# Patient Record
Sex: Female | Born: 1965 | Race: White | Hispanic: No | Marital: Single | Smoking: Former smoker
Health system: Southern US, Community
[De-identification: ages and names within clinical notes are randomized; demographics above are authoritative.]

## PROBLEM LIST (undated history)

## (undated) DIAGNOSIS — S82009A Unspecified fracture of unspecified patella, initial encounter for closed fracture: Secondary | ICD-10-CM

## (undated) DIAGNOSIS — K219 Gastro-esophageal reflux disease without esophagitis: Secondary | ICD-10-CM

## (undated) DIAGNOSIS — E119 Type 2 diabetes mellitus without complications: Secondary | ICD-10-CM

## (undated) DIAGNOSIS — E785 Hyperlipidemia, unspecified: Secondary | ICD-10-CM

## (undated) DIAGNOSIS — T7840XA Allergy, unspecified, initial encounter: Secondary | ICD-10-CM

## (undated) DIAGNOSIS — S62609A Fracture of unspecified phalanx of unspecified finger, initial encounter for closed fracture: Secondary | ICD-10-CM

## (undated) HISTORY — DX: Unspecified fracture of unspecified patella, initial encounter for closed fracture: S82.009A

## (undated) HISTORY — DX: Gastro-esophageal reflux disease without esophagitis: K21.9

## (undated) HISTORY — PX: TONSILLECTOMY: SUR1361

## (undated) HISTORY — PX: BREAST BIOPSY: SHX20

## (undated) HISTORY — DX: Fracture of unspecified phalanx of unspecified finger, initial encounter for closed fracture: S62.609A

## (undated) HISTORY — DX: Allergy, unspecified, initial encounter: T78.40XA

## (undated) HISTORY — DX: Type 2 diabetes mellitus without complications: E11.9

## (undated) HISTORY — DX: Hyperlipidemia, unspecified: E78.5

---

## 2001-11-12 ENCOUNTER — Ambulatory Visit (HOSPITAL_COMMUNITY): Admission: RE | Admit: 2001-11-12 | Discharge: 2001-11-12 | Payer: Self-pay | Admitting: Pulmonary Disease

## 2001-11-18 ENCOUNTER — Encounter (HOSPITAL_COMMUNITY): Admission: RE | Admit: 2001-11-18 | Discharge: 2001-12-18 | Payer: Self-pay | Admitting: Pulmonary Disease

## 2006-11-11 ENCOUNTER — Ambulatory Visit: Payer: Self-pay | Admitting: Gastroenterology

## 2006-11-20 ENCOUNTER — Ambulatory Visit (HOSPITAL_COMMUNITY): Admission: RE | Admit: 2006-11-20 | Discharge: 2006-11-20 | Payer: Self-pay | Admitting: Gastroenterology

## 2007-03-05 ENCOUNTER — Ambulatory Visit (HOSPITAL_COMMUNITY): Admission: RE | Admit: 2007-03-05 | Discharge: 2007-03-05 | Payer: Self-pay | Admitting: Pulmonary Disease

## 2007-04-29 DIAGNOSIS — E119 Type 2 diabetes mellitus without complications: Secondary | ICD-10-CM | POA: Insufficient documentation

## 2007-04-29 DIAGNOSIS — T7840XA Allergy, unspecified, initial encounter: Secondary | ICD-10-CM | POA: Insufficient documentation

## 2007-04-29 DIAGNOSIS — E7800 Pure hypercholesterolemia, unspecified: Secondary | ICD-10-CM | POA: Insufficient documentation

## 2007-04-29 DIAGNOSIS — J45909 Unspecified asthma, uncomplicated: Secondary | ICD-10-CM | POA: Insufficient documentation

## 2008-09-18 ENCOUNTER — Emergency Department (HOSPITAL_COMMUNITY): Admission: EM | Admit: 2008-09-18 | Discharge: 2008-09-18 | Payer: Self-pay | Admitting: Emergency Medicine

## 2009-11-05 ENCOUNTER — Observation Stay (HOSPITAL_COMMUNITY): Admission: EM | Admit: 2009-11-05 | Discharge: 2009-11-06 | Payer: Self-pay | Admitting: Emergency Medicine

## 2010-05-02 LAB — CARDIAC PANEL(CRET KIN+CKTOT+MB+TROPI)
CK, MB: 0.4 ng/mL (ref 0.3–4.0)
CK, MB: 0.4 ng/mL (ref 0.3–4.0)
CK, MB: 0.7 ng/mL (ref 0.3–4.0)
Relative Index: INVALID (ref 0.0–2.5)
Relative Index: INVALID (ref 0.0–2.5)
Relative Index: INVALID (ref 0.0–2.5)
Total CK: 16 U/L (ref 7–177)
Total CK: 16 U/L (ref 7–177)
Total CK: 18 U/L (ref 7–177)
Troponin I: <0.01 ng/mL (ref 0.00–0.06)
Troponin I: <0.01 ng/mL (ref 0.00–0.06)
Troponin I: <0.01 ng/mL (ref 0.00–0.06)

## 2010-05-02 LAB — D-DIMER, QUANTITATIVE: D-Dimer, Quant: 0.29 ug/mL-FEU (ref 0.00–0.48)

## 2010-05-02 LAB — GLUCOSE, CAPILLARY
Glucose-Capillary: 111 mg/dL — ABNORMAL HIGH (ref 70–99)
Glucose-Capillary: 144 mg/dL — ABNORMAL HIGH (ref 70–99)
Glucose-Capillary: 210 mg/dL — ABNORMAL HIGH (ref 70–99)
Glucose-Capillary: 72 mg/dL (ref 70–99)

## 2010-05-02 LAB — POCT CARDIAC MARKERS
CKMB, poc: <1 ng/mL — ABNORMAL LOW (ref 1.0–8.0)
CKMB, poc: <1 ng/mL — ABNORMAL LOW (ref 1.0–8.0)
Myoglobin, poc: 21.5 ng/mL (ref 12–200)
Myoglobin, poc: 26.8 ng/mL (ref 12–200)
Troponin i, poc: <0.05 ng/mL (ref 0.00–0.09)
Troponin i, poc: <0.05 ng/mL (ref 0.00–0.09)

## 2010-05-02 LAB — DIFFERENTIAL
Basophils Absolute: 0 10*3/uL (ref 0.0–0.1)
Basophils Absolute: 0 10*3/uL (ref 0.0–0.1)
Basophils Relative: 0 % (ref 0–1)
Basophils Relative: 0 % (ref 0–1)
Eosinophils Absolute: 0.1 10*3/uL (ref 0.0–0.7)
Eosinophils Absolute: 0.2 10*3/uL (ref 0.0–0.7)
Eosinophils Relative: 1 % (ref 0–5)
Eosinophils Relative: 2 % (ref 0–5)
Lymphocytes Relative: 27 % (ref 12–46)
Lymphocytes Relative: 42 % (ref 12–46)
Lymphs Abs: 2.6 10*3/uL (ref 0.7–4.0)
Lymphs Abs: 4.2 10*3/uL — ABNORMAL HIGH (ref 0.7–4.0)
Monocytes Absolute: 0.6 10*3/uL (ref 0.1–1.0)
Monocytes Absolute: 0.6 10*3/uL (ref 0.1–1.0)
Monocytes Relative: 6 % (ref 3–12)
Monocytes Relative: 6 % (ref 3–12)
Neutro Abs: 5 10*3/uL (ref 1.7–7.7)
Neutro Abs: 6.4 10*3/uL (ref 1.7–7.7)
Neutrophils Relative %: 50 % (ref 43–77)
Neutrophils Relative %: 66 % (ref 43–77)

## 2010-05-02 LAB — COMPREHENSIVE METABOLIC PANEL
ALT: 44 U/L — ABNORMAL HIGH (ref 0–35)
AST: 32 U/L (ref 0–37)
Albumin: 4 g/dL (ref 3.5–5.2)
Alkaline Phosphatase: 44 U/L (ref 39–117)
BUN: 9 mg/dL (ref 6–23)
CO2: 25 mEq/L (ref 19–32)
Calcium: 9.4 mg/dL (ref 8.4–10.5)
Chloride: 101 mEq/L (ref 96–112)
Creatinine, Ser: 0.54 mg/dL (ref 0.4–1.2)
GFR calc Af Amer: >60 mL/min (ref >60)
GFR calc non Af Amer: >60 mL/min (ref >60)
Glucose, Bld: 277 mg/dL — ABNORMAL HIGH (ref 70–99)
Potassium: 3.9 mEq/L (ref 3.5–5.1)
Sodium: 135 mEq/L (ref 135–145)
Total Bilirubin: 0.6 mg/dL (ref 0.3–1.2)
Total Protein: 7 g/dL (ref 6.0–8.3)

## 2010-05-02 LAB — URINALYSIS, ROUTINE W REFLEX MICROSCOPIC
Bilirubin Urine: NEGATIVE
Glucose, UA: >1000 mg/dL — AB
Hgb urine dipstick: NEGATIVE
Ketones, ur: NEGATIVE mg/dL
Leukocytes, UA: NEGATIVE
Nitrite: NEGATIVE
Protein, ur: NEGATIVE mg/dL
Specific Gravity, Urine: <1.005 — ABNORMAL LOW (ref 1.005–1.030)
Urobilinogen, UA: 0.2 mg/dL (ref 0.0–1.0)
pH: 6 (ref 5.0–8.0)

## 2010-05-02 LAB — BASIC METABOLIC PANEL
BUN: 8 mg/dL (ref 6–23)
CO2: 24 mEq/L (ref 19–32)
Calcium: 8.6 mg/dL (ref 8.4–10.5)
Chloride: 107 mEq/L (ref 96–112)
Creatinine, Ser: 0.65 mg/dL (ref 0.4–1.2)
GFR calc Af Amer: >60 mL/min (ref >60)
GFR calc non Af Amer: >60 mL/min (ref >60)
Glucose, Bld: 114 mg/dL — ABNORMAL HIGH (ref 70–99)
Potassium: 4.1 mEq/L (ref 3.5–5.1)
Sodium: 140 mEq/L (ref 135–145)

## 2010-05-02 LAB — CBC
HCT: 33.4 % — ABNORMAL LOW (ref 36.0–46.0)
HCT: 38.1 % (ref 36.0–46.0)
Hemoglobin: 11.5 g/dL — ABNORMAL LOW (ref 12.0–15.0)
Hemoglobin: 13.1 g/dL (ref 12.0–15.0)
MCH: 32.5 pg (ref 26.0–34.0)
MCH: 32.9 pg (ref 26.0–34.0)
MCHC: 34.3 g/dL (ref 30.0–36.0)
MCHC: 34.5 g/dL (ref 30.0–36.0)
MCV: 94.8 fL (ref 78.0–100.0)
MCV: 95.3 fL (ref 78.0–100.0)
Platelets: 249 10*3/uL (ref 150–400)
Platelets: 290 10*3/uL (ref 150–400)
RBC: 3.5 MIL/uL — ABNORMAL LOW (ref 3.87–5.11)
RBC: 4.02 MIL/uL (ref 3.87–5.11)
RDW: 12.3 % (ref 11.5–15.5)
RDW: 12.3 % (ref 11.5–15.5)
WBC: 10 10*3/uL (ref 4.0–10.5)
WBC: 9.7 10*3/uL (ref 4.0–10.5)

## 2010-05-02 LAB — LIPID PANEL
Cholesterol: 179 mg/dL (ref 0–200)
HDL: 34 mg/dL — ABNORMAL LOW (ref >39)
LDL Cholesterol: UNDETERMINED mg/dL (ref 0–99)
Total CHOL/HDL Ratio: 5.3 RATIO
Triglycerides: 503 mg/dL — ABNORMAL HIGH (ref <150)
VLDL: UNDETERMINED mg/dL (ref 0–40)

## 2010-05-02 LAB — PROTIME-INR
INR: 0.94 (ref 0.00–1.49)
Prothrombin Time: 12.8 seconds (ref 11.6–15.2)

## 2010-05-02 LAB — URINE MICROSCOPIC-ADD ON

## 2010-05-02 LAB — TSH: TSH: 4.578 u[IU]/mL — ABNORMAL HIGH (ref 0.350–4.500)

## 2010-05-02 LAB — HEMOGLOBIN A1C
Hgb A1c MFr Bld: 7.2 % — ABNORMAL HIGH (ref <5.7)
Mean Plasma Glucose: 160 mg/dL — ABNORMAL HIGH (ref <117)

## 2010-05-26 LAB — URINALYSIS, ROUTINE W REFLEX MICROSCOPIC
Bilirubin Urine: NEGATIVE
Glucose, UA: >1000 mg/dL — AB
Hgb urine dipstick: NEGATIVE
Ketones, ur: NEGATIVE mg/dL
Leukocytes, UA: NEGATIVE
Nitrite: NEGATIVE
Protein, ur: NEGATIVE mg/dL
Specific Gravity, Urine: 1.01 (ref 1.005–1.030)
Urobilinogen, UA: 0.2 mg/dL (ref 0.0–1.0)
pH: 5.5 (ref 5.0–8.0)

## 2010-05-26 LAB — COMPREHENSIVE METABOLIC PANEL
ALT: 18 U/L (ref 0–35)
AST: 20 U/L (ref 0–37)
Albumin: 4.3 g/dL (ref 3.5–5.2)
Alkaline Phosphatase: 42 U/L (ref 39–117)
BUN: 16 mg/dL (ref 6–23)
CO2: 28 mEq/L (ref 19–32)
Calcium: 9.8 mg/dL (ref 8.4–10.5)
Chloride: 99 mEq/L (ref 96–112)
Creatinine, Ser: 0.78 mg/dL (ref 0.4–1.2)
GFR calc Af Amer: >60 mL/min (ref >60)
GFR calc non Af Amer: >60 mL/min (ref >60)
Glucose, Bld: 256 mg/dL — ABNORMAL HIGH (ref 70–99)
Potassium: 4.1 mEq/L (ref 3.5–5.1)
Sodium: 137 mEq/L (ref 135–145)
Total Bilirubin: 1.1 mg/dL (ref 0.3–1.2)
Total Protein: 7.6 g/dL (ref 6.0–8.3)

## 2010-05-26 LAB — GLUCOSE, CAPILLARY
Glucose-Capillary: 128 mg/dL — ABNORMAL HIGH (ref 70–99)
Glucose-Capillary: 224 mg/dL — ABNORMAL HIGH (ref 70–99)

## 2010-05-26 LAB — DIFFERENTIAL
Basophils Absolute: 0.1 10*3/uL (ref 0.0–0.1)
Basophils Relative: 1 % (ref 0–1)
Eosinophils Absolute: 0.1 10*3/uL (ref 0.0–0.7)
Eosinophils Relative: 1 % (ref 0–5)
Lymphocytes Relative: 19 % (ref 12–46)
Lymphs Abs: 2.6 10*3/uL (ref 0.7–4.0)
Monocytes Absolute: 0.5 10*3/uL (ref 0.1–1.0)
Monocytes Relative: 4 % (ref 3–12)
Neutro Abs: 10.2 10*3/uL — ABNORMAL HIGH (ref 1.7–7.7)
Neutrophils Relative %: 76 % (ref 43–77)

## 2010-05-26 LAB — PREGNANCY, URINE: Preg Test, Ur: NEGATIVE

## 2010-05-26 LAB — POCT CARDIAC MARKERS
CKMB, poc: <1 ng/mL — ABNORMAL LOW (ref 1.0–8.0)
Myoglobin, poc: 39.2 ng/mL (ref 12–200)
Troponin i, poc: <0.05 ng/mL (ref 0.00–0.09)

## 2010-05-26 LAB — URINE MICROSCOPIC-ADD ON

## 2010-05-26 LAB — CBC
HCT: 42.8 % (ref 36.0–46.0)
Hemoglobin: 14.9 g/dL (ref 12.0–15.0)
MCHC: 34.9 g/dL (ref 30.0–36.0)
MCV: 94 fL (ref 78.0–100.0)
Platelets: 260 10*3/uL (ref 150–400)
RBC: 4.56 MIL/uL (ref 3.87–5.11)
RDW: 12.7 % (ref 11.5–15.5)
WBC: 13.4 10*3/uL — ABNORMAL HIGH (ref 4.0–10.5)

## 2010-07-02 NOTE — Assessment & Plan Note (Signed)
[facility]                         GASTROENTEROLOGY OFFICE NOTE   NAME:[name] [name] [name]                       MRN:          [id number]  DATE:11/11/2006                            DOB:          Aug 12, 1965    PROBLEM:  Abdominal distension and diarrhea.   REASON:  [name] [name] is a 45 year old white female referred for the  evaluation of the above. Over the past two years, she has been  complaining of chronic diarrhea. This followed what sounds like an  enteric infection that she acquired from well water. Up to that point,  she had no GI complaints. She was treated with antibiotics for this  infection, however, since that time she has been complaining of frequent  diarrhea up to six times a day. She will awaken with the urge to  defecate. There is no history of melena or hematochezia. Colonoscopy in  October 2007 apparently demonstrated small polyps including a  hyperplastic polyp. She underwent upper endoscopy in May 2008 that  showed erosions in either the stomach and/or the esophagus. She remains  on omeprazole. Most recently, she was empirically treated with  amoxicillin and metronidazole. She reports cessation of her diarrhea.  She is now having formed, but flaky stools. These occur 1 to 2 times a  day. She still complains of abdominal distension with discomfort. She  denies nausea or pyrosis.   PAST MEDICAL HISTORY:  Pertinent for diabetes and asthma.   FAMILY HISTORY:  Positive for her mother who had pancreatic cancer and  her father who had lymphoma. Both parents had diabetes.   MEDICATIONS:  1. Omeprazole.  2. Glipizide.  3. Metformin.  4. Simvastatin.  5. Metronidazole.   ALLERGIES:  SULFA.   SOCIAL HISTORY:  She neither smokes nor drinks. 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   REVIEW OF SYSTEMS:  Positive for back pain and fatigue.   PHYSICAL EXAMINATION:  VITAL SIGNS:  Pulse 76, blood pressure 122/80,  weight 191.  HEENT:  EOMI.  PERRLA.  Sclerae are anicteric.  Conjunctivae are pink.  NECK:  Supple without thyromegaly, adenopathy or carotid bruits.  CHEST:  Clear to auscultation and percussion without adventitious  sounds.  CARDIAC:  Regular rhythm; normal S1 S2.  There are no murmurs, gallops  or rubs.  ABDOMEN:  Bowel sounds are normoactive.  Abdomen is soft, nontender and  nondistended.  There are no abdominal masses, tenderness, splenic  enlargement or hepatomegaly.  EXTREMITIES:  Full range of motion.  No cyanosis, clubbing or edema.  RECTAL:  Deferred.   IMPRESSION:  1. Two year history of diarrhea following what sounds an enteric      infection. Symptoms certainly could be related to IBS following      infection. Bacterial overgrowth from a motility disorder remains in      consideration, especially in a few of her recent improvement of      symptoms after antibiotic therapy. I think that it is unlikely that      she has recurrent enteric infections or pseudomembranous colitis in      view of  her multiple stool studies that apparently were negative.      Microscopic colitis is also a consideration.  2. Abdominal pain and distension. Gastroparesis ought to be ruled out.      Symptoms may also be related to #1.  3. Diabetes.  4. History of colon polyps (questionable adenomatous).   RECOMMENDATION:  1. Hold GI workup for diarrhea unless symptoms recur. If this is      bacterial overgrowth, then I would expect her diarrhea to return      after several weeks.  2. Gastric emptying scan.     Barbette Hair. Arlyce Dice, MD,FACG  Electronically Signed    RDK/MedQ  DD: 11/11/2006  DT: 11/12/2006  Job #: [id number]   cc:   Foye Clock, MD  L. Delco, MD, [facility]

## 2010-07-02 NOTE — Letter (Signed)
November 11, 2006    Foye Clock, MD  [address] [facility]. Resident  [place], [place] [address]   RE:  [name], [name]  MRN:  [id number]  /  DOB:  Jul 05, 1965   Dear Dr. Dahlia Client:   Upon your kind referral, I had the pleasure of evaluating your patient  and I am pleased to offer my findings.  I saw [name] [name] in the office  today.  Enclosed is a copy of my progress note that details my findings  and recommendations.   Thank you for the opportunity to participate in your patient's care.    Sincerely,      Barbette Hair. Arlyce Dice, MD,FACG  Electronically Signed    RDK/MedQ  DD: 11/11/2006  DT: 11/12/2006  Job #: [id number]

## 2010-07-02 NOTE — Letter (Signed)
November 11, 2006    Claudean Kinds   RE:  AHLAM, PISCITELLI  MRN:  [id number]  /  DOB:  01-07-1966   Dear Ms. Brooke Dare:   It is my pleasure to have treated you recently as a new patient in my  office.  I appreciate your confidence and the opportunity to participate  in your care.   Since I do have a busy inpatient endoscopy schedule and office schedule,  my office hours vary weekly.  I am, however, available for emergency  calls every day through my office.  If I cannot promptly meet an urgent  office appointment, another one of our gastroenterologists will be able  to assist you.   My well-trained staff are prepared to help you at all times.  For  emergencies after office hours, a physician from our gastroenterology  section is always available through my 24-hour answering service.   While you are under my care, I encourage discussion of your questions  and concerns, and I will be happy to return your calls as soon as I am  available.   Once again, I welcome you as a new patient and I look forward to a happy  and healthy relationship.    Sincerely,      Barbette Hair. Arlyce Dice, MD,FACG  Electronically Signed   RDK/MedQ  DD: 11/11/2006  DT: 11/12/2006  Job #: [id number]

## 2011-03-04 ENCOUNTER — Ambulatory Visit: Payer: Self-pay

## 2011-03-04 DIAGNOSIS — Z0289 Encounter for other administrative examinations: Secondary | ICD-10-CM

## 2011-08-05 ENCOUNTER — Encounter (HOSPITAL_COMMUNITY): Payer: Self-pay | Admitting: Emergency Medicine

## 2011-08-05 ENCOUNTER — Emergency Department (HOSPITAL_COMMUNITY)
Admission: EM | Admit: 2011-08-05 | Discharge: 2011-08-05 | Disposition: A | Discharge status: Home / Self Care | Payer: Non-veteran care | Attending: Emergency Medicine | Admitting: Emergency Medicine

## 2011-08-05 DIAGNOSIS — Z881 Allergy status to other antibiotic agents status: Secondary | ICD-10-CM | POA: Insufficient documentation

## 2011-08-05 DIAGNOSIS — R0789 Other chest pain: Secondary | ICD-10-CM

## 2011-08-05 DIAGNOSIS — Z87891 Personal history of nicotine dependence: Secondary | ICD-10-CM | POA: Insufficient documentation

## 2011-08-05 DIAGNOSIS — R071 Chest pain on breathing: Secondary | ICD-10-CM | POA: Insufficient documentation

## 2011-08-05 DIAGNOSIS — X58XXXA Exposure to other specified factors, initial encounter: Secondary | ICD-10-CM | POA: Insufficient documentation

## 2011-08-05 DIAGNOSIS — S139XXA Sprain of joints and ligaments of unspecified parts of neck, initial encounter: Secondary | ICD-10-CM | POA: Insufficient documentation

## 2011-08-05 DIAGNOSIS — S161XXA Strain of muscle, fascia and tendon at neck level, initial encounter: Secondary | ICD-10-CM

## 2011-08-05 MED ORDER — OXYCODONE-ACETAMINOPHEN 5-325 MG PO TABS: ORAL_TABLET | ORAL | Status: AC

## 2011-08-05 NOTE — ED Notes (Signed)
Pt. Stated, I think I had food poisoning and from throwing up its made my neck, back hurt.

## 2011-08-05 NOTE — Discharge Instructions (Signed)
Cervical Sprain A cervical sprain is an injury in the neck in which the ligaments are stretched or torn. The ligaments are the tissues that hold the bones of the neck (vertebrae) in place.Cervical sprains can range from very mild to very severe. Most cervical sprains get better in 1 to 3 weeks, but it depends on the cause and extent of the injury. Severe cervical sprains can cause the neck vertebrae to be unstable. This can lead to damage of the spinal cord and can result in serious nervous system problems. Your caregiver will determine whether your cervical sprain is mild or severe. CAUSES  Severe cervical sprains may be caused by:  Contact sport injuries (football, rugby, wrestling, hockey, auto racing, gymnastics, diving, martial arts, boxing).   Motor vehicle collisions.   Whiplash injuries. This means the neck is forcefully whipped backward and forward.   Falls.  Mild cervical sprains may be caused by:   Awkward positions, such as cradling a telephone between your ear and shoulder.   Sitting in a chair that does not offer proper support.   Working at a poorly designed computer station.   Activities that require looking up or down for long periods of time.  SYMPTOMS   Pain, soreness, stiffness, or a burning sensation in the front, back, or sides of the neck. This discomfort may develop immediately after injury or it may develop slowly and not begin for 24 hours or more after an injury.   Pain or tenderness directly in the middle of the back of the neck.   Shoulder or upper back pain.   Limited ability to move the neck.   Headache.   Dizziness.   Weakness, numbness, or tingling in the hands or arms.   Muscle spasms.   Difficulty swallowing or chewing.   Tenderness and swelling of the neck.  DIAGNOSIS  Most of the time, your caregiver can diagnose this problem by taking your history and doing a physical exam. Your caregiver will ask about any known problems, such as  arthritis in the neck or a previous neck injury. X-rays may be taken to find out if there are any other problems, such as problems with the bones of the neck. However, an X-ray often does not reveal the full extent of a cervical sprain. Other tests such as a computed tomography (CT) scan or magnetic resonance imaging (MRI) may be needed. TREATMENT  Treatment depends on the severity of the cervical sprain. Mild sprains can be treated with rest, keeping the neck in place (immobilization), and pain medicines. Severe cervical sprains need immediate immobilization and an appointment with an orthopedist or neurosurgeon. Several treatment options are available to help with pain, muscle spasms, and other symptoms. Your caregiver may prescribe:  Medicines, such as pain relievers, numbing medicines, or muscle relaxants.   Physical therapy. This can include stretching exercises, strengthening exercises, and posture training. Exercises and improved posture can help stabilize the neck, strengthen muscles, and help stop symptoms from returning.   A neck collar to be worn for short periods of time. Often, these collars are worn for comfort. However, certain collars may be worn to protect the neck and prevent further worsening of a serious cervical sprain.  HOME CARE INSTRUCTIONS   Put ice on the injured area.   Put ice in a plastic bag.   Place a towel between your skin and the bag.   Leave the ice on for 15 to 20 minutes, 3 to 4 times a day.     Only take over-the-counter or prescription medicines for pain, discomfort, or fever as directed by your caregiver.   Keep all follow-up appointments as directed by your caregiver.   Keep all physical therapy appointments as directed by your caregiver.   If a neck collar is prescribed, wear it as directed by your caregiver.   Do not drive while wearing a neck collar.   Make any needed adjustments to your work station to promote good posture.   Avoid positions  and activities that make your symptoms worse.   Warm up and stretch before being active to help prevent problems.  SEEK MEDICAL CARE IF:   Your pain is not controlled with medicine.   You are unable to decrease your pain medicine over time as planned.   Your activity level is not improving as expected.  SEEK IMMEDIATE MEDICAL CARE IF:   You develop any bleeding, stomach upset, or signs of an allergic reaction to your medicine.   Your symptoms get worse.   You develop new, unexplained symptoms.   You have numbness, tingling, weakness, or paralysis in any part of your body.  MAKE SURE YOU:   Understand these instructions.   Will watch your condition.   Will get help right away if you are not doing well or get worse.  Document Released: 12/01/2006 Document Revised: 01/23/2011 Document Reviewed: 11/06/2010 [facility] Patient Information 2012 [place], [place].    Chest Wall Pain Chest wall pain is pain in or around the bones and muscles of your chest. It may take up to 6 weeks to get better. It may take longer if you must stay physically active in your work and activities.  CAUSES  Chest wall pain may happen on its own. However, it may be caused by:  A viral illness like the flu.   Injury.   Coughing.   Exercise.   Arthritis.   Fibromyalgia.   Shingles.  HOME CARE INSTRUCTIONS   Avoid overtiring physical activity. Try not to strain or perform activities that cause pain. This includes any activities using your chest or your abdominal and side muscles, especially if heavy weights are used.   Put ice on the sore area.   Put ice in a plastic bag.   Place a towel between your skin and the bag.   Leave the ice on for 15 to 20 minutes per hour while awake for the first 2 days.   Only take over-the-counter or prescription medicines for pain, discomfort, or fever as directed by your caregiver.  SEEK IMMEDIATE MEDICAL CARE IF:   Your pain increases, or you are very  uncomfortable.   You have a fever.   Your chest pain becomes worse.   You have new, unexplained symptoms.   You have nausea or vomiting.   You feel sweaty or lightheaded.   You have a cough with phlegm (sputum), or you cough up blood.  MAKE SURE YOU:   Understand these instructions.   Will watch your condition.   Will get help right away if you are not doing well or get worse.  Document Released: 02/03/2005 Document Revised: 01/23/2011 Document Reviewed: 09/30/2010 [facility] Patient Information 2012 [place], [place].Chest Wall Pain Chest wall pain is pain in or around the bones and muscles of your chest. It may take up to 6 weeks to get better. It may take longer if you must stay physically active in your work and activities.  CAUSES  Chest wall pain may happen on its own. However, it may be  caused by:  A viral illness like the flu.   Injury.   Coughing.   Exercise.   Arthritis.   Fibromyalgia.   Shingles.  HOME CARE INSTRUCTIONS   Avoid overtiring physical activity. Try not to strain or perform activities that cause pain. This includes any activities using your chest or your abdominal and side muscles, especially if heavy weights are used.   Put ice on the sore area.   Put ice in a plastic bag.   Place a towel between your skin and the bag.   Leave the ice on for 15 to 20 minutes per hour while awake for the first 2 days.   Only take over-the-counter or prescription medicines for pain, discomfort, or fever as directed by your caregiver.  SEEK IMMEDIATE MEDICAL CARE IF:   Your pain increases, or you are very uncomfortable.   You have a fever.   Your chest pain becomes worse.   You have new, unexplained symptoms.   You have nausea or vomiting.   You feel sweaty or lightheaded.   You have a cough with phlegm (sputum), or you cough up blood.  MAKE SURE YOU:   Understand these instructions.   Will watch your condition.   Will get help right away if  you are not doing well or get worse.  Document Released: 02/03/2005 Document Revised: 01/23/2011 Document Reviewed: 09/30/2010 [facility] Patient Information 2012 [place], [place].    Narcotic and benzodiazepine use may cause drowsiness, slowed breathing or dependence.  Please use with caution and do not drive, operate machinery or watch young children alone while taking them.  Taking combinations of these medications or drinking alcohol will potentiate these effects.

## 2011-08-05 NOTE — ED Provider Notes (Signed)
History   This chart was scribed for [name] [name]. Oletta Lamas, MD by Sofie Rower. The patient was seen in room TR10C/TR10C and the patient's care was started at 2:21PM    CSN: [id number]  Arrival date & time 08/05/11  1335   First MD Initiated Contact with Patient 08/05/11 1419      Chief Complaint  Patient presents with  . Neck Pain    (Consider location/radiation/quality/duration/timing/severity/associated sxs/prior treatment) Patient is a 46 y.o. female presenting with neck pain. The history is provided by the patient. No language interpreter was used.  Neck Pain  This is a new problem. The current episode started more than 2 days ago. The problem occurs constantly. The problem has not changed since onset.The pain is associated with an unknown (Vomiting.) factor. There has been no fever. The pain is present in the right side. The pain does not radiate. The pain is moderate. The symptoms are aggravated by twisting. The pain is the same all the time. Pertinent negatives include no chest pain, no numbness, no headaches, no tingling and no weakness.    JAZMEN LINDENBAUM is a 46 y.o. female who presents to the Emergency Department complaining of moderate, episodic neck pain located at the back right  Posterior neck onset three days ago with associated symptoms of vomiting, diarrhea, back pain, difficulty breathing. The pt states she ate some food last Friday and thinks she got food poisoning. She vomited profusely well over 20 times and also diarrhea.The next morning she felt like she may have slept wrong on her neck, however the neck pain has gotten worse. Modifying factors include taking ibuprofen, oxycodone which provides moderate relief. Pt has a hx of breathing problems, asthma, allergy to sulfa antibiotics. No change in voice, no difficulty swallowing.  No numbness or weakness or arms or legs.  [facility] and [facility] attack because supposedly she had one in her  sleep based on an ECG that they had done months after supposed event.  Pt denies smoking (quit in 1996) , pain in the lower extremities, weakness on the either side of the body, difficulty swallowing.      History  Substance Use Topics  . Smoking status: Passive Smoker  . Smokeless tobacco: Not on file  . Alcohol Use: No    OB History    Grav Para Term Preterm Abortions TAB SAB Ect Mult Living                  Review of Systems  HENT: Positive for neck pain.   Respiratory: Positive for chest tightness and shortness of breath.   Cardiovascular: Negative for chest pain, palpitations and leg swelling.  Gastrointestinal: Positive for nausea, vomiting and diarrhea.  Musculoskeletal: Positive for back pain.       Neck pain.   Skin: Negative for pallor, rash and wound.  Neurological: Negative for dizziness, tingling, weakness, numbness and headaches.  All other systems reviewed and are negative.    Allergies  Sulfa antibiotics  Home Medications   Current Outpatient Rx  Name Route Sig Dispense Refill  . ASPIRIN EC 81 MG PO TBEC Oral Take 81 mg by mouth daily.    Marland Kitchen GLIPIZIDE 10 MG PO TABS Oral Take 10 mg by mouth 2 (two) times daily before a meal.    . METFORMIN HCL 1000 MG PO TABS Oral Take 1,000 mg by mouth 2 (two) times daily with a meal.    .  THERA M PLUS PO TABS Oral Take 1 tablet by mouth daily.    Marland Kitchen OMEPRAZOLE 40 MG PO CPDR Oral Take 40 mg by mouth daily.    Marland Kitchen SIMVASTATIN 20 MG PO TABS Oral Take 10 mg by mouth daily.    . OXYCODONE-ACETAMINOPHEN 5-325 MG PO TABS  1-2 tablets po q 6 hours prn moderate to severe pain 20 tablet 0    BP 137/91  Pulse 89  Temp 98.7 F (37.1 C) (Oral)  Resp 16  SpO2 96%  Physical Exam  Nursing note and vitals reviewed. Constitutional: She appears well-developed and well-nourished. No distress.  HENT:  Right Ear: External ear normal.  Mouth/Throat: Oropharynx is clear and moist.  Neck: Normal range of motion and phonation normal.  Neck supple. Muscular tenderness present. No tracheal tenderness and no spinous process tenderness present. No tracheal deviation and normal range of motion present.       Tenderness at the lateral right side of the neck. No stridor, no swelling.   Cardiovascular: Normal rate.   No murmur heard. Pulmonary/Chest: Effort normal. No accessory muscle usage or stridor. No respiratory distress. She has no wheezes. She has no rales. She exhibits tenderness (Minimal chest tenderness at the right chest wall. ).  Abdominal: Soft. There is no tenderness.  Musculoskeletal: She exhibits tenderness (Lateral right side of neck.). She exhibits no edema.       Negative homans sign bilaterally, minimal tenderness in the right chest wall, no CVA tenderness.   Lymphadenopathy:    She has no cervical adenopathy.  Neurological: She is alert.  Skin: Skin is warm and dry. No rash noted. She is not diaphoretic. No pallor.  Psychiatric: She has a normal mood and affect. Her behavior is normal.    ED Course  Procedures (including critical care time)  DIAGNOSTIC STUDIES: Oxygen Saturation is 96% on room air, adequate by my interpretation.    COORDINATION OF CARE:  2:30PM- EDP at bedside discusses treatment plan.    Labs Reviewed - No data to display No results found.   1. Neck strain   2. Chest wall pain     ECG at time 14:54 shows NSR at rate 76, normal axis, normal intervals, No ST or T wave abn's.  Low QRS voltages.  No sig change from ECG on 11/03/2009.    MDM  I personally performed the services described in this documentation, which was scribed in my presence. The recorded information has been reviewed and considered.   Pt with reproducible chest wall pain on right, right lateral neck and anterior chest area on right upper region without rash, deformity.  No abn lung sounds.  Symptoms are not consistent with CAD. ECG shows no ischemia.    [name] [name]. Oletta Lamas, MD 08/06/11 2126

## 2011-08-06 ENCOUNTER — Encounter (HOSPITAL_COMMUNITY): Payer: Self-pay | Admitting: Emergency Medicine

## 2012-03-10 ENCOUNTER — Ambulatory Visit: Payer: Self-pay | Admitting: Physician Assistant

## 2012-03-10 VITALS — BP 123/75 | HR 67 | Temp 98.0°F | Resp 16 | Ht 65.0 in | Wt 180.0 lb

## 2012-03-10 DIAGNOSIS — E119 Type 2 diabetes mellitus without complications: Secondary | ICD-10-CM

## 2012-03-10 DIAGNOSIS — Z0289 Encounter for other administrative examinations: Secondary | ICD-10-CM

## 2012-03-10 LAB — POCT GLYCOSYLATED HEMOGLOBIN (HGB A1C): Hemoglobin A1C: 7.4

## 2012-03-10 NOTE — Progress Notes (Signed)
Subjective:    Patient ID: [name] [name], female    DOB: 06-04-1965, 47 y.o.   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  Pt does have DM and is followed by Dr. Dahlia Client at [facility].   Current regimen includes metformin and glipizide.  States her last visit was in Sept 2013.  Does not recall A1C but states all labs have always been normal.  Does check sugars at home, states she generally sees numbers in the 200s.  Wears corrective lenses.  States she regularly sees the eye doctor for retinal scans.  She is a non-smoker.     Review of Systems  All other systems reviewed and are negative.       Objective:   Physical Exam  Vitals reviewed. Constitutional: She is oriented to person, place, and time. She appears well-developed and well-nourished. No distress.  HENT:  Head: Normocephalic and atraumatic.  Right Ear: Tympanic membrane and ear canal normal.  Left Ear: Tympanic membrane and ear canal normal.  Mouth/Throat: Uvula is midline, oropharynx is clear and moist and mucous membranes are normal.  Eyes: Conjunctivae normal and EOM are normal. Pupils are equal, round, and reactive to light. No scleral icterus.  Fundoscopic exam:      The right eye shows no arteriolar narrowing, no AV nicking, no exudate, no hemorrhage and no papilledema. The right eye shows red reflex.      The left eye shows no arteriolar narrowing, no AV nicking, no exudate, no hemorrhage and no papilledema. The left eye shows red reflex. Neck: Neck supple.  Cardiovascular: Normal rate, regular rhythm, normal heart sounds and intact distal pulses.  Exam reveals no gallop and no friction rub.   No murmur heard. Pulses:      Radial pulses are 2+ on the right side, and 2+ on the left side.       Posterior tibial pulses are 2+ on the right side, and 2+ on the left side.  Abdominal: Soft. Bowel sounds are normal. She exhibits no distension and no mass. There is no tenderness. There  is no rebound and no guarding.  Musculoskeletal: Normal range of motion.       Cervical back: Normal.       Thoracic back: Normal.       Lumbar back: Normal.  Lymphadenopathy:    She has no cervical adenopathy.  Neurological: She is alert and oriented to person, place, and time. She has normal strength. No cranial nerve deficit or sensory deficit.  Reflex Scores:      Bicep reflexes are 2+ on the right side and 2+ on the left side.      Patellar reflexes are 2+ on the right side and 2+ on the left side.      Achilles reflexes are 2+ on the right side and 2+ on the left side. Skin: Skin is warm and dry.  Psychiatric: She has a normal mood and affect. Her behavior is normal.      Filed Vitals:   03/10/12 1857  BP: 123/75  Pulse: 67  Temp: 98 F (36.7 C)  Resp: 16     Results for orders placed in visit on 03/10/12  POCT GLYCOSYLATED HEMOGLOBIN (HGB A1C)      Component Value Range   Hemoglobin A1C 7.4         Assessment & Plan:   1. Health examination of defined subpopulation  2. 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  Pt with glucosuria on urine dipstick.  A1C 7.4 indicating adequate control.  Encouraged pt to continue making healthy lifestyle choices and to continue following up with PCP.  Physical exam is WNL.  Ok to certify for 1 yr.  DOT card signed and dated.

## 2012-03-10 NOTE — Patient Instructions (Addendum)
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  Continue making healthy lifestyle choices and faithfully taking your medications.  Continue following up with Dr. Dahlia Client   Diabetes Meal Planning Guide The diabetes meal planning guide is a tool to help you plan your meals and snacks. It is important for people with diabetes to manage their blood glucose (sugar) levels. Choosing the right foods and the right amounts throughout your day will help control your blood glucose. Eating right can even help you improve your blood pressure and reach or maintain a healthy weight. CARBOHYDRATE COUNTING MADE EASY When you eat carbohydrates, they turn to sugar. This raises your blood glucose level. Counting carbohydrates can help you control this level so you feel better. When you plan your meals by counting carbohydrates, you can have more flexibility in what you eat and balance your medicine with your food intake. Carbohydrate counting simply means adding up the total amount of carbohydrate grams in your meals and snacks. Try to eat about the same amount at each meal. Foods with carbohydrates are listed below. Each portion below is 1 carbohydrate serving or 15 grams of carbohydrates. Ask your dietician how many grams of carbohydrates you should eat at each meal or snack. Grains and Starches  1 slice bread.   English muffin or hotdog/hamburger bun.   cup cold cereal (unsweetened).   cup cooked pasta or rice.   cup starchy vegetables (corn, potatoes, peas, beans, winter squash).  1 tortilla (6 inches).   bagel.  1 waffle or pancake (size of a CD).   cup cooked cereal.  4 to 6 small crackers. *Whole grain is recommended. Fruit  1 cup fresh unsweetened berries, melon, papaya, pineapple.  1 small fresh fruit.   banana or mango.   cup fruit juice (4 oz unsweetened).   cup canned fruit in natural juice or water.  2 tbs dried fruit.  12 to 15 grapes or cherries. Milk and Yogurt  1 cup  fat-free or 1% milk.  1 cup soy milk.  6 oz light yogurt with sugar-free sweetener.  6 oz low-fat soy yogurt.  6 oz plain yogurt. Vegetables  1 cup raw or  cup cooked is counted as 0 carbohydrates or a "free" food.  If you eat 3 or more servings at 1 meal, count them as 1 carbohydrate serving. Other Carbohydrates   oz chips or pretzels.   cup ice cream or frozen yogurt.   cup sherbet or sorbet.  2 inch square cake, no frosting.  1 tbs honey, sugar, jam, jelly, or syrup.  2 small cookies.  3 squares of graham crackers.  3 cups popcorn.  6 crackers.  1 cup broth-based soup.  Count 1 cup casserole or other mixed foods as 2 carbohydrate servings.  Foods with less than 20 calories in a serving may be counted as 0 carbohydrates or a "free" food. You may want to purchase a book or computer software that lists the carbohydrate gram counts of different foods. In addition, the nutrition facts panel on the labels of the foods you eat are a good source of this information. The label will tell you how big the serving size is and the total number of carbohydrate grams you will be eating per serving. Divide this number by 15 to obtain the number of carbohydrate servings in a portion. Remember, 1 carbohydrate serving equals 15 grams of carbohydrate. SERVING SIZES Measuring foods and serving sizes helps you make sure you are getting the right  amount of food. The list below tells how big or small some common serving sizes are.  1 oz.........4 stacked dice.  3 oz........Marland KitchenDeck of cards.  1 tsp.......Marland KitchenTip of little finger.  1 tbs......Marland KitchenMarland KitchenThumb.  2 tbs.......Marland KitchenGolf ball.   cup......Marland KitchenHalf of a fist.  1 cup.......Marland KitchenA fist. SAMPLE DIABETES MEAL PLAN Below is a sample meal plan that includes foods from the grain and starches, dairy, vegetable, fruit, and meat groups. A dietician can individualize a meal plan to fit your calorie needs and tell you the number of servings needed from  each food group. However, controlling the total amount of carbohydrates in your meal or snack is more important than making sure you include all of the food groups at every meal. You may interchange carbohydrate containing foods (dairy, starches, and fruits). The meal plan below is an example of a 2000 calorie diet using carbohydrate counting. This meal plan has 17 carbohydrate servings. Breakfast  1 cup oatmeal (2 carb servings).   cup light yogurt (1 carb serving).  1 cup blueberries (1 carb serving).   cup almonds. Snack  1 large apple (2 carb servings).  1 low-fat string cheese stick. Lunch  Chicken breast salad.  1 cup spinach.   cup chopped tomatoes.  2 oz chicken breast, sliced.  2 tbs low-fat [place] dressing.  12 whole-wheat crackers (2 carb servings).  12 to 15 grapes (1 carb serving).  1 cup low-fat milk (1 carb serving). Snack  1 cup carrots.   cup hummus (1 carb serving). Dinner  3 oz broiled salmon.  1 cup brown rice (3 carb servings). Snack  1  cups steamed broccoli (1 carb serving) drizzled with 1 tsp olive oil and lemon juice.  1 cup light pudding (2 carb servings). DIABETES MEAL PLANNING WORKSHEET Your dietician can use this worksheet to help you decide how many servings of foods and what types of foods are right for you.  BREAKFAST Food Group and Servings / Carb Servings Grain/Starches __________________________________ Dairy __________________________________________ Vegetable ______________________________________ Fruit ___________________________________________ Meat __________________________________________ Fat ____________________________________________ LUNCH Food Group and Servings / Carb Servings Grain/Starches ___________________________________ Dairy ___________________________________________ Fruit ____________________________________________ Meat ___________________________________________ Fat  _____________________________________________ Laural Golden Food Group and Servings / Carb Servings Grain/Starches ___________________________________ Dairy ___________________________________________ Fruit ____________________________________________ Meat ___________________________________________ Fat _____________________________________________ SNACKS Food Group and Servings / Carb Servings Grain/Starches ___________________________________ Dairy ___________________________________________ Vegetable _______________________________________ Fruit ____________________________________________ Meat ___________________________________________ Fat _____________________________________________ DAILY TOTALS Starches _________________________ Vegetable ________________________ Fruit ____________________________ Dairy ____________________________ Meat ____________________________ Fat ______________________________ Document Released: 10/31/2004 Document Revised: 04/28/2011 Document Reviewed: 09/11/2008 Exit[employer] Patient Information 2013 [place], [place].

## 2012-05-20 ENCOUNTER — Encounter: Payer: Self-pay | Admitting: Physician Assistant

## 2012-06-30 NOTE — Progress Notes (Signed)
Subjective:      Patient ID: Yvonne King is Yvonne King 47 y.o. female.    HPI  Yvonne King is here today for evaluation of Yvonne King ganglion cyst of her right wrist.  It has been present for 3-4 years, comes and goes.  Has seen her chiropractor for this with no permanent results.      Review of Systems   Cardiovascular:        Hypertension - controlled with medication.     All other systems reviewed and are negative.           Objective:   Physical Exam   Nursing note and vitals reviewed.  Constitutional: She is oriented to person, place, and time. She appears well-developed and well-nourished.   HENT:   Head: Normocephalic and atraumatic.   Eyes: Conjunctivae and EOM are normal. Pupils are equal, round, and reactive to light. Right eye exhibits no discharge. Left eye exhibits no discharge.   Neck: Normal range of motion. Neck supple.   Cardiovascular: Normal rate and regular rhythm.    Pulmonary/Chest: Effort normal. No respiratory distress. She has no wheezes.   Musculoskeletal: She exhibits tenderness. She exhibits no edema.   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  It is very tender to palpation.    Neurological: She is alert and oriented to person, place, and time. No cranial nerve deficit. Coordination normal.   Skin: Skin is warm and dry. No rash noted. No erythema.   Psychiatric: She has Yvonne King normal mood and affect. Her behavior is normal. Judgment and thought content normal.        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        Plan:      Excision of ganglion cyst right dorsal wrist under general anesthesia.  Risks and benefits were explained to the patient.  All questions are answered.  Written informed consent is obtained today in the office.          I confirm that I have personally examined the patient and have reviewed the above note(s) and confirmed the key elements of history, physical exam and progress notes, portions of which were  transcribed by Olena Leatherwood directly from my in-room dictation.   Electronically signed by Oleh Genin, MD

## 2012-07-03 ENCOUNTER — Encounter: Payer: Self-pay | Admitting: Physician Assistant

## 2012-07-14 NOTE — Patient Instructions (Signed)
DAY OF SURGERY/PROCEDURE  GUIDELINES    As a patient at the [facility] you can expect quality medical and nursing care that is centered on your individual needs. It is our goal to make your surgical experience as comfortable and excellent as possible.  ________________________________________________________________________    The following instructions are general guidelines, if any information on this sheet is different from what your doctor has instructed you to do, please follow your doctor's instructions.    ?? Please arrive on time or your procedure ay be rescheduled  ?? Enter through front entrance of the building. Surgery Center will be located to your right.  ?? Upon arrival you will be taken to the pre-operative area to get ready for surgery, your family will stay in the waiting room and visit with you once you are ready for surgery. Due to special limitations please limit visitation to 1-2 members of your family at a time. When it is time for surgery your family will return to the waiting room.  ?? You will be given a specific time when you will NOT be able to eat, drink, smoke, suck or chew ANYTHING (no water, gum, mints, cigarettes, cigars, pipes, snuff, chewing tobacco, etc.) or your surgery may be canceled. This will occur during your PAT appointment or by phone 1-2 days before surgery  ?? Take a shower or bath on the morning of your surgery/procedure (Hibiclens if directed)  ?? Brush your teeth, but do not swallow any water  ?? IN CASE OF ILLNESS - If you have a cold or flu symptoms (high fever, runny nose, sore throat, cough, etc.) rash, nausea, vomiting, loose stools, and/or recent contact with someone who has a contagious disease (chick pox, measles, etc.) please call your doctor before coming to the surgery center  ?? Take a small sip of water with heart, blood pressure, and/or seizure medication the morning of surgery. (DO NOT take blood pressure medications that contain a  diuretic)  ?? If applicable bring your:  ?? Inhaler (s)  ?? Hearing aid(s)  ?? Eyeglasses and Case (If you wear contacts they have to be removed before surgery, bring case and solution)  ?? Any paperwork given to you by your doctor  ?? Any X-rays you were told to bring  ?? A copy of your Living Will or Durable Power of Attorney    ?? DO NOT take anticoagulants (blood thinners, aspiring or aspirin-containing products) two weeks prior to your surgery. You may start taking again 2 days post-operatively, unless otherwise directed by your doctor.    ?? DO NOT take any diabetic pills or insulin. Please bring your sliding scale instructions and sick day plan with you. If you have a low blood sugar reaction after midnight, drink 4 ounces of apple juice or regular pop.    ?? Wear loose, comfortable clothing that is easy to put on and take off. A locker will be provided to store your clothing during the procedure. The key can be given to a family member/friend or it will remain in post-op with the nurse.    ?? If you loose will be returning home the same day as your surgery, you will need to have a responsible adult (18 years of age or older) present to drive you home. We strongly encourage you to have someone stay with you at home for 24 hours following your surgery. This is due to the anesthesia and the medication given to you during surgery and recovery.    ??   Your doctor may talk with your family immediately following your procedure. Depending on your needs, you will stay in the recovery room between 30 minutes to 2 hours.    ?? While you are recovering, the surgery family waiting room receptionist can answer many of your family's questions. All medically related questions will be answered by a medical professional. If you had outpatient surgery, you will meet your family for discharge instructions before leaving.    Surgical Site Infection  A surgical site infection can occur after surgery. It happens in the part of the body where  the surgery took place. Most patients who have surgery do not develop an infection.   SYMPTOMS  ?? Redness and pain around the surgical site.  ?? Drainage of cloudy fluid from the wound.  ?? Fever.  PREVENTION  Before the procedure:  ?? Tell your caregiver about any medical problems you may have. Health problems such as allergies, diabetes, and obesity could affect your surgery and treatment.  ?? Quit smoking. Patients who smoke get more infections. Talk to your caregiver about how you can quit before your surgery.  ?? Do not shave near the area where you will have surgery. Shaving with a razor can irritate your skin and make it easier to get an infection. Your caregiver may use an electric clipper to remove some of your hair immediately before surgery.  ?? Ask if you will get antibiotic medicine. In most cases, you should get antibiotics within 60 minutes before surgery. Antibiotics should be stopped within 24 hours after surgery.  ?? Your caregivers will clean their hands and arms up to their elbows with an antiseptic agent just before the surgery. Your caregivers will also clean their hands with soap and water or an alcohol-based hand rub before and after caring for each patient.  ?? Your caregivers will wear hair covers, masks, gowns, and gloves during surgery to keep the surgery area clean.  ?? Your caregivers will clean the skin at the surgery site with a soap that kills germs.  After the procedure:  ?? Make sure your caregivers clean their hands before examining you. They may use soap and water or an alcohol-based hand rub. If you do not see your caregivers clean their hands, ask them to do so.  ?? Make sure family and friends who visit you do not touch the surgical wound or dressings.  ?? Ask family and friends to clean their hands with soap and water or an alcohol-based hand rub before and after visiting you.  ?? Make sure you know how to care for your wound before you leave the hospital. Your caregiver will tell you how  to take care of your wound.  ?? Make sure you know whom to contact if you have questions or problems after you get home.  TREATMENT  Most surgical site infections can be treated with antibiotics. Sometimes, patients need another surgery to treat the infection.  HOME CARE INSTRUCTIONS  Always clean your hands before and after caring for your wound.  SEEK IMMEDIATE MEDICAL CARE IF:  You have any symptoms of an infection, such as drainage, fever, or redness and pain at the surgery site.

## 2012-07-16 LAB — BUN & CREATININE
BUN: 12 mg/dL (ref 6–20)
Creatinine: 0.73 mg/dL (ref 0.50–0.90)
GFR African American: >60 mL/min (ref >60)
GFR Non-African American: >60 mL/min (ref >60)

## 2012-07-16 LAB — GLUCOSE, RANDOM: Glucose: 84 mg/dL (ref 70–99)

## 2012-07-19 NOTE — Progress Notes (Signed)
Stress test EKG done on 05/11/2012 shown to Dr Candy Sledge, who stated EKG/stress test ok. States ok to proceed per anesthesia.

## 2012-07-29 MED ORDER — FAMOTIDINE 20 MG/2ML IV SOLN
20 | Freq: Once | INTRAVENOUS | Status: AC | PRN
Start: 2012-07-29 — End: 2012-07-29
  Administered 2012-07-29: 16:00:00 20 mg via INTRAVENOUS

## 2012-07-29 MED ORDER — DIPHENHYDRAMINE HCL 50 MG/ML IJ SOLN
50 | Freq: Once | INTRAMUSCULAR | Status: AC | PRN
Start: 2012-07-29 — End: 2012-07-29

## 2012-07-29 MED ORDER — FENTANYL CITRATE 0.05 MG/ML IJ SOLN
0.05 | INTRAMUSCULAR | Status: AC | PRN
Start: 2012-07-29 — End: 2012-07-29
  Administered 2012-07-29 (×4): 25 ug via INTRAVENOUS

## 2012-07-29 MED ORDER — LIDOCAINE HCL (PF) 1 % IJ SOLN
1 | Freq: Once | INTRAMUSCULAR | Status: AC | PRN
Start: 2012-07-29 — End: 2012-07-29

## 2012-07-29 MED ORDER — ONDANSETRON HCL 4 MG/2ML IJ SOLN
4 | Freq: Once | INTRAMUSCULAR | Status: AC | PRN
Start: 2012-07-29 — End: 2012-07-29

## 2012-07-29 MED ADMIN — midazolam (VERSED) injection 1 mg: 1 mg | INTRAVENOUS | @ 17:00:00 | NDC 00641605710

## 2012-07-29 MED ADMIN — HYDROcodone-acetaminophen (NORCO) 5-325 MG per tablet 2 tablet: ORAL | @ 18:00:00 | NDC 00406036523

## 2012-07-29 MED ADMIN — lactated ringers infusion: INTRAVENOUS | @ 15:00:00 | NDC 00338011704

## 2012-07-29 NOTE — Progress Notes (Signed)
Pt and spouse given both verbal and written discharge instructions along with rx as written, both verbalized understanding. Pt getting dressed at this time. IV discontinued at this time.

## 2012-07-29 NOTE — Anesthesia Post-Procedure Evaluation (Signed)
Department of Anesthesiology  Post-Anesthesia Note    Name:  Yvonne King                                         Age:  47 y.o.  MRN:  [id number]     Last Vitals:  BP 130/92   Pulse 68   Temp(Src) 97.2 ??F (36.2 ??C) (Temporal)   Resp 15   Ht 5' 4.5" (1.638 m)   Wt 165 lb (74.844 kg)   BMI 27.9 kg/m2   SpO2 98%   LMP 03/17/2008  Patient Vitals for the past 4 hrs:   BP Temp Temp src Pulse Resp SpO2 Height Weight   07/29/12 1352 130/92 mmHg - - 68 15 98 % - -   07/29/12 1350 134/89 mmHg - - 81 18 98 % - -   07/29/12 1328 128/88 mmHg 97.2 ??F (36.2 ??C) Temporal 88 16 95 % - -   07/29/12 1043 121/80 mmHg - - 81 16 95 % 5' 4.5" (1.638 m) 165 lb (74.844 kg)       Level of Consciousness:  Awake    Respiratory:  Stable    Oxygen Saturation:  Stable    Cardiovascular:  Stable    Hydration:  Adequate    PONV:  Stable    Post-op Pain:  Adequate analgesia    Post-op Assessment:  No apparent anesthetic complications    Additional Follow-Up / Treatment / Comment:  None    Cristy Hilts, MD  July 29, 2012   2:16 PM

## 2012-07-29 NOTE — Brief Op Note (Signed)
Brief Postoperative Note    Yvonne King  Date of Birth:  [birth year]  [id number]    Pre-operative Diagnosis: R dorsal wrist ganglion    Post-operative Diagnosis: Same    Procedure: R dorsal wrist ganglion excision    Anesthesia: General    Surgeons/Assistants: Dalagiannis, Vear Clock    Estimated Blood Loss: minimal    Fluids: 1000 cc crystalloid    Complications: None    Specimens: Was Obtained: R wrist ganglion    Findings: See op note:    Implants: none    Electronically signed by Deretha Emory, DO on 07/29/2012 at 1:31 PM

## 2012-07-29 NOTE — Plan of Care (Signed)
NURSING CARE PLAN: PERIOPERATIVE    Nursing Diagnosis    Risk for anxiety related to knowledge deficit and stress of surgery    Interventions/Activities    Assesses psychosocial status: anxious  Comment:    Determines knowledge level of patient and family/support persons regarding invasive procedure Yes  Comment:    Provides instruction as needed to patient and family/support person Yes  Comment:    Communicates patient concern (s) to appropriate members of health care teamYes  Comment:    Explains expected sequence of events and perioperative routine Yes  Comment:    Demonstrates understanding of instructions: Yes  Comment:     Outcome Statement    Decrease Anxiety    I have reviewed the care plan, precautions, and safety instructions for this procedure, and I concur with the [facility] documentation of Yvonne King.    RN Signature: Lynnda Child  Time: 10:50 AM  Date: 07/29/2012

## 2012-07-29 NOTE — Anesthesia Pre-Procedure Evaluation (Signed)
[facility]    Department of Anesthesiology  Pre-Anesthesia Evaluation/Consultation         Name:  Yvonne King                                         Age:  47 y.o.  MRN:  [id number]           Procedure (Scheduled):  Cyst wrist  Surgeon:  Dr. Caleen Essex    Medications  Current Outpatient Prescriptions   Medication Sig Dispense Refill   ??? lisinopril (PRINIVIL;ZESTRIL) 2.5 MG tablet Take 5 mg by mouth daily.       ??? hydrochlorothiazide (HYDRODIURIL) 25 MG tablet Take 25 mg by mouth daily.       ??? omeprazole (PRILOSEC) 20 MG capsule Take 20 mg by mouth daily.         Current Facility-Administered Medications   Medication Dose Route Frequency Provider Last Rate Last Dose   ??? Famotidine (PEPCID) injection 20 mg  20 mg Intravenous Once PRN Cristy Hilts, MD           Allergies   Allergen Reactions   ??? Imitrex (Sumatriptan) Other (See Comments)     Bradycardia    ??? Sulfa Antibiotics Hives     Patient Active Problem List   Diagnosis   ??? Ganglion, unspecified     Past Medical History   Diagnosis Date   ??? HTN (hypertension)    ??? Migraine    ??? GERD (gastroesophageal reflux disease)    ??? Endometriosis      Past Surgical History   Procedure Laterality Date   ??? Hysterectomy     ??? Cesarean section     ??? Appendectomy     ??? Pelvic laparoscopy       History   Substance Use Topics   ??? Smoking status: Current Every Day Smoker -- 1.00 packs/day   ??? Smokeless tobacco: Not on file   ??? Alcohol Use: Yes      Comment: social         Vital Signs (Current)   Filed Vitals:    07/29/12 1043   BP: 121/80   Pulse: 81   Resp: 16     Vital Signs Statistics (for past 48 hrs)     BP  Min: 121/80   Min taken time: 07/29/12 1043  Max: 121/80   Max taken time: 07/29/12 1043  Pulse  Avg: 81  Min: 81   Min taken time: 07/29/12 1043  Max: 81   Max taken time: 07/29/12 1043  Resp  Avg: 16  Min: 16   Min taken time: 07/29/12 1043  Max: 16   Max taken time: 07/29/12 1043  SpO2  Avg: 95 %  Min: 95 %   Min taken time: 07/29/12 1043  Max: 95 %   Max taken time:  07/29/12 1043  BP Readings from Last 3 Encounters:   07/29/12 121/80   07/16/12 135/87       BMI  Body mass index is 27.9 kg/(m^2).    CBC   No results found for this basename: WBC, RBC, HGB, HCT, MCV, RDW, PLT       CMP    Lab Results   Component Value Date    BUN 12 07/16/2012    CREATININE 0.73 07/16/2012    GFRAA >60 07/16/2012    LABGLOM >60 07/16/2012  GLUCOSE 84 07/16/2012       BMP    Lab Results   Component Value Date    BUN 12 07/16/2012    CREATININE 0.73 07/16/2012    GFRAA >60 07/16/2012    LABGLOM >60 07/16/2012    GLUCOSE 84 07/16/2012       POC Testing  No results found for this basename: POCGLU, POCNA, POCK, POCCL, POCBUN, POCHEMO, POCHCT,  in the last 72 hours    Coags    No results found for this basename: PROTIME, INR, APTT       HCG (If Applicable)   No results found for this basename: PREGTESTUR, PREGSERUM, HCG, HCGQUANT        ABGs   No results found for this basename: PHART, PO2ART, PCO2ART, HCO3ART, BEART, O2SATART        Type & Screen (If Applicable)  No results found for this basename: LABABO, LABRH       Radiology (If Applicable)    Cardiac Testing (If Applicable)     EKG (If Applicable)              Anesthesia Evaluation      No history of anesthetic complications   Airway   Mallampati: II  TM distance: >3 FB  Neck ROM: full  Dental        Pulmonary    (-) asthma, shortness of breath, sleep apnea, stridor  Cardiovascular   (+) hypertension,   (-) pacemaker, CABG/stent, angina    Rate: normal    Neuro/Psych    (+) headaches,   (-) seizures, CVA  GI/Hepatic/Renal    (+) GERD well controlled,     Endo/Other    (-) no type II diabetes, hypothyroidism  Abdominal   Abdomen: soft.                  Allergies: Imitrex and Sulfa antibiotics    NPO Status: Time of last liquid consumption: 2245                       Time of last solid food consumption: 2030    Anesthesia Plan    ASA 2     general     intravenous induction   Anesthetic plan and risks discussed with patient.          [employer]  07/29/2012

## 2012-07-29 NOTE — Discharge Instructions (Signed)
Activity   You have had anesthesia today  Do not drive, operate heavy equipment, consume alcoholic beverages, or make any important decisions  for 24 hours   If you are taking pain medication: Do not drive or consume alcohol.  Take your time changing positions today. You may feel light headed or dizzy if you move too quickly.   Continue your home medications as ordered by your physician.  Avoid aspirin and over the counter medications (including vitamins, and herbal supplements) for 3 days, unless otherwise instructed by your physician.    Rest for the next 24 hours. Getting enough rest will help you recover.   No  heavy lifting or straining right after surgery.   No  strenuous activities as your doctor or other health care professional recommends. Examples of these might include weightlifting, running, or playing sports.   You may bathe/shower, keeping bandage clean and dry after 48 hours  Elevate the extremity    Diet   You can eat your normal diet when you feel well. You should start off with bland foods like chicken soup, toast, or yogurt. Then advance as tolerated.  Drink plenty of fluids (unless your doctor tells you not to). Your urine should be very lightly colored without a strong odor.  Medicines   If the doctor gave you a prescription medicine for pain, take it as needed as prescribed.   Care of the cut (incision)   Do not change dressing   You may reinforce dressing with gauze if needed    If you have strips of tape on the cut (incision) the doctor made, leave the tape on until it falls off.   You may cover the area with a gauze bandage if it weeps or rubs against clothing.   Keep the area clean and dry.  Call your doctor now or seek immediate medical care if:    You have pain that does not get better after you take pain medicine.    You have a fever over 101F.    You have chills    You have blue or discolored fingers or nailbeds   You have signs of infection, such as:   Increased pain, swelling,  warmth, or redness.   Red streaks leading from the incision.   Pus draining from the incision

## 2012-08-01 NOTE — Op Note (Signed)
[address] [facility]                 [address] [place], [place]  [address]                                 OPERATIVE REPORT    Patient: Yvonne King, Yvonne King                  Reg#:  [id number]   MRN   [id number]  Birth Date:      19-Jan-1966  Patient Status:  OA                         Clinic Code:      SVARROW  Room:                                       Adm:              07/29/2012  Sex:             F                          Dis:  Attending:       A. Buena Irish,     Date of Surgery:  07/29/2012                   M.D.  Dct By:          Lynden Ang, D.O.  PCP Phys:        Albina Billet, D.O.    Document#:       [id number]                    Job#:             [id number]  Date/Time Typed: 08/02/2012 11:07 A         By:               Tamela Gammon  Date/Time Dct:   08/01/2012    11:11 A      PC                B                                              Facility:         [facility]0    SURGEON:  A. Buena Irish, M.D.    ASSISTANT:  Lynden Ang, D.O.    PREOPERATIVE DIAGNOSIS:  Right wrist ganglion.    POSTOPERATIVE DIAGNOSIS:  Right wrist ganglion.    PROCEDURE:  Right dorsal wrist ganglion excision.    ANESTHESIA:  General.    ESTIMATED BLOOD LOSS:  Minimal.    FLUIDS:  Crystalloid 1000 mL.    COMPLICATIONS:  None.    SPECIMEN:  Was obtained and it was a right wrist ganglion.    IMPLANTS:  None.    INDICATIONS:  The patient is a 47 year old that was seen in the office prior to today's  operation.  She reports the cyst on the dorsal aspect of her right wrist  has been present for 3 to 4  years and it is intermittent in its symptoms.  She states she has significant pain with this lesion.  She has been treated  by a chiropractor in the past with no permanent relief.  All risks,  benefits and alternatives to surgery were disclosed to the patient at this  time and the patient elected to proceed with excision of the ganglion.  Today the patient was seen in the preoperative holding area and consent  was  again reviewed with the patient who elected to proceed with surgery.    PROCEDURE:  The correct operative site was then marked and the patient was transported  to the operating room and transferred to the operating table with the hand  table attached to the right side of the bed.  General anesthesia was  induced by the anesthesia team.  A tourniquet was then applied to the right  upper extremity and the extremity was then prepped and draped in the usual  sterile fashion.  The mass on the dorsal aspect of the wrist was palpated  and then outlined using a surgical marker.  A dorsal incision was then made  over the mass through the skin with a 15 blade scalpel.  Next further  dissection was carried out using tenotomy scissors.  The ganglion was  identified and was separated from surrounding soft tissue circumferentially  using the tenotomies.  On the base of the lesion, the cyst was identified  which did appear to be extruding from the scapholunate joint.  The ganglion  was removed and the capsule was then inspected.  A small portion of the  capsule was then excised as well.  Cautery was used extensively to try and  assure no reoccurrence of the lesion.  Next the wound was copiously  irrigated with sterile normal saline.  The wound was closed with deep  Monocryl, sealed with dermabond, and covered with sterile compressive  dressing.  The tourniquet was then released and patient was extubated by   the anesthesia team and transported to the PACU where she was found to  be in stable condition.      "In order to promptly notify physicians concerning their patients, this  unconfirmed document is being released.  It is not considered final until  reviewed and signed."                                                             ____________________________                                                 Lynden Ang, D.O.  cc:    Albina Billet, D.O.         Oleh Genin, M.D.         Lynden Ang, D.O.

## 2012-08-02 LAB — SURGICAL PATHOLOGY

## 2012-08-09 NOTE — Progress Notes (Signed)
POST-OP:     Yvonne King is here today s/p excision of Yvonne King ganglion cyst from her right dorsal wrist 07/29/12.       She is doing well.  Incision is healing well.  No signs of recurrence at this time.   Yvonne King is instructed to begin ROM exercises and daily scar massage.      She is advised to call with any problems or concerns, otherwise she will follow up with [place] in 2 weeks.  .      I confirm that I have personally examined the patient and have reviewed the above note(s) and confirmed the key elements of history, physical exam and progress notes, portions of which were transcribed by Olena Leatherwood directly from my in-room dictation.   Electronically signed by Oleh Genin, MD

## 2012-08-25 NOTE — Progress Notes (Signed)
POST-OP:     Elky is here today s/p excision of Alvino Lechuga ganglion cyst from her right wrist.      She is doing well.  There is no evidence of recurrence at this time.  She has Grey Schlauch small inflamed area along her incision, it is improving and it is felt that this was probably related to Wilfrido Luedke suture.  There are no signs of infection or cellulitis.    Afsa is instructed to perform gentle massage and work on ROM exercises.       Gioia is instructed to call with any problems or concerns, otherwise she will follow up with [place] in 3 months.      I confirm that I have personally examined the patient and have reviewed the above note(s) and confirmed the key elements of history, physical exam and progress notes, portions of which were transcribed by Olena Leatherwood directly from my in-room dictation.   Electronically signed by Oleh Genin, MD

## 2012-11-24 NOTE — Patient Instructions (Signed)
Thank you for enrolling in MyChart. Please follow the instructions below to securely access your online medical record. MyChart allows you to send messages to your doctor, view your test results, renew your prescriptions, schedule appointments, and more.     How Do I Sign Up?  1. In your Internet browser, go to [email or web address]  2. Click on the Sign Up Now link in the Sign In box. You will see the New Member Sign Up page.  3. Enter your MyChart Access Code exactly as it appears below. You will not need to use this code after you???ve completed the sign-up process. If you do not sign up before the expiration date, you must request a new code.  MyChart Access Code: [email or web address]  Expires: 01/23/2013  9:02 AM    4. Enter your Social Security Number ([email or web address]) and Date of Birth (mm/dd/yyyy) as indicated and click Submit. You will be taken to the next sign-up page.  5. Create a MyChart ID. This will be your MyChart login ID and cannot be changed, so think of one that is secure and easy to remember.  6. Create a MyChart password. You can change your password at any time.  7. Enter your Password Reset Question and Answer. This can be used at a later time if you forget your password.   8. Enter your e-mail address. You will receive e-mail notification when new information is available in MyChart.  9. Click Sign Up. You can now view your medical record.     Additional Information  If you have questions, please contact your physician practice where you receive care. Remember, MyChart is NOT to be used for urgent needs. For medical emergencies, dial 911.

## 2012-11-24 NOTE — Progress Notes (Signed)
FOLLOW-UP:     Yvonne King is here today for follow up s/p excision of Yvonne King ganglion from her right dorsal wrist 07/29/12.      She is doing well.  Yvonne King has good ROM.  Incision is well healed.  Does not appear to be any recurrence at this time.     Follow up with [place] as needed.      I confirm that I have personally examined the patient and have reviewed the above note(s) and confirmed the key elements of history, physical exam and progress notes, portions of which were transcribed by Olena Leatherwood directly from my in-room dictation.   Electronically signed by Oleh Genin, MD

## 2013-04-15 MED ORDER — HYDROCHLOROTHIAZIDE 25 MG PO TABS
25 MG | ORAL_TABLET | ORAL | Status: DC
Start: 2013-04-15 — End: 2013-10-16

## 2013-06-17 ENCOUNTER — Ambulatory Visit: Payer: Self-pay | Admitting: Family Medicine

## 2013-06-17 VITALS — BP 116/84 | HR 80 | Temp 98.4°F | Resp 16 | Ht 64.25 in | Wt 180.4 lb

## 2013-06-17 DIAGNOSIS — Z0000 Encounter for general adult medical examination without abnormal findings: Secondary | ICD-10-CM

## 2013-06-17 DIAGNOSIS — E119 Type 2 diabetes mellitus without complications: Secondary | ICD-10-CM

## 2013-06-17 DIAGNOSIS — Z0289 Encounter for other administrative examinations: Secondary | ICD-10-CM

## 2013-06-17 LAB — GLUCOSE, POCT (MANUAL RESULT ENTRY): POC Glucose: 159 mg/dl — AB (ref 70–99)

## 2013-06-17 NOTE — Patient Instructions (Signed)
You are good for a one year card today.  Take care and please come and see us in about a month to recheck your diabetes.

## 2013-06-17 NOTE — Progress Notes (Signed)
[facility] [address], [place] [address] [phone]  Date:  06/17/2013   Name:  [name] [name]   DOB:  1965-03-02   MRN:  [id number]  PCP:  Default, Provider, MD    Chief Complaint: DOT PE   History of Present Illness:  [name] BadgerDeborah D Rasch is a 48 y.o. very pleasant female patient who presents with the following:  She is here today for a DOT exam.  She is an over the road driver.   She had an A1c in January that was 7.4%  She is generally healthy except for DM and high cholesterol.  [facility] and [facility] most of her health care there.    Patient Active Problem List   Diagnosis Date Noted  . DIABETES MELLITUS 04/29/2007  . HYPERCHOLESTEROLEMIA 04/29/2007  . ASTHMA 04/29/2007  . ALLERGY 04/29/2007    Past Medical History  Diagnosis Date  . Hyperlipidemia   . Diabetes mellitus without complication   . Allergy   . GERD (gastroesophageal reflux disease)     Past Surgical History  Procedure Laterality Date  . Eye surgery      History  Substance Use Topics  . Smoking status: Passive Smoke Exposure - Never Smoker  . Smokeless tobacco: Not on file     Comment: quit 1997  . Alcohol Use: No    Family History  Problem Relation Age of Onset  . Cancer Mother   . Diabetes Mother   . Cancer Father   . Diabetes Father   . Heart disease Father     Allergies  Allergen Reactions  . Sulfa Antibiotics Rash    Medication list has been reviewed and updated.  Current Outpatient Prescriptions on File Prior to Visit  Medication Sig Dispense Refill  . aspirin EC 81 MG tablet Take 81 mg by mouth daily.      Marland Kitchen. glipiZIDE (GLUCOTROL) 10 MG tablet Take 10 mg by mouth 2 (two) times daily before a meal.      . metFORMIN (GLUCOPHAGE) 1000 MG tablet Take 1,000 mg by mouth 2 (two) times daily with a meal.      . Multiple Vitamins-Minerals (MULTIVITAMINS THER. W/MINERALS) TABS Take 1 tablet by mouth daily.      Marland Kitchen. omeprazole (PRILOSEC) 40 MG capsule  Take 40 mg by mouth daily.      . simvastatin (ZOCOR) 20 MG tablet Take 10 mg by mouth daily.       No current facility-administered medications on file prior to visit.    Review of Systems:  As per HPI- otherwise negative.   Physical Examination: Filed Vitals:   06/17/13 1750  BP: 116/84  Pulse: 80  Temp: 98.4 F (36.9 C)  Resp: 16   Filed Vitals:   06/17/13 1750  Height: 5' 4.25" (1.632 m)  Weight: 180 lb 6.4 oz (81.829 kg)   Body mass index is 30.72 kg/(m^2). Ideal Body Weight: Weight in (lb) to have BMI = 25: 146.5  GEN: WDWN, NAD, Non-toxic, A & O x 3, obese, looks well HEENT: Atraumatic, Normocephalic. Neck supple. No masses, No LAD.  Bilateral TM wnl, oropharynx normal.  PEERL,EOMI.   Ears and Nose: No external deformity. CV: RRR, No M/G/R. No JVD. No thrill. No extra heart sounds. PULM: CTA B, no wheezes, crackles, rhonchi. No retractions. No resp. distress. No accessory muscle use. ABD: S, NT, ND. No rebound. No HSM. EXTR: No c/c/e NEURO Normal gait.  PSYCH: Normally  interactive. Conversant. Not depressed or anxious appearing.  Calm demeanor.  Normal strength, sensation and DTR all extremities   Results for orders placed in visit on 06/17/13  GLUCOSE, POCT (MANUAL RESULT ENTRY)      Result Value Ref Range   POC Glucose 159 (*) 70 - 99 mg/dl    Assessment and Plan: Type II or unspecified type diabetes mellitus without mention of complication, not stated as uncontrolled - Plan: POCT glucose (manual entry)  Physical exam  Diabetes mel seems under good control.    Cleared for a one year card with corrective lenses  Signed Abbe AmsterdamJessica Emmilyn Crooke, MD

## 2013-08-31 NOTE — Telephone Encounter (Signed)
PT CALLED STATING SHE HAD DOUBLE VISION THIS AM, HAD TO PULL OFF THE EWAY, WANTED TO KNOW WHAT TO DO, I ADVISED HER TO GO TO THE ER.

## 2013-09-01 MED ORDER — FLEXERIL 10 MG PO TABS
10 MG | ORAL_TABLET | Freq: Three times a day (TID) | ORAL | Status: DC | PRN
Start: 2013-09-01 — End: 2021-05-01

## 2013-09-01 NOTE — Progress Notes (Signed)
[facility]  [address]  [place] [id number]  Dept: [phone]  Dept Fax: [phone]    Claudean KindsDeborah Beechy is a 48 y.o. female who presents today for her medical conditions/complaints as noted below.  Claudean KindsDeborah Schofield is c/o of   Chief Complaint   Patient presents with   ??? Diplopia     happened when she was driving.patient felt out of it all day.   ??? Headache     patient had a dizzy spell  and went to urgent care happened about two months ago was treated for sinus infection has not had one again.     Have you seen any other physician or provider since your last visit no    Have you had any other diagnostic tests since your last visit? no    Have you changed or stopped any medications since your last visit including any over-the-counter medicines, vitamins, or herbal medicines? no     Are you taking all your prescribed medications? Yes  If NO, why? -  N/A           Patient Self-Management Goal for this visit.   What is your goal for your visit today? - follow up from [facility]   Barriers to success: none   Plan for overcoming my barriers: N/A      Confidence: 10/10   Date goal set: 09/01/2013   Date expected to reach goal: 110month    Medical history Review  Past Medical, Family, and Social History reviewed and does contribute to the patient presenting condition    Health Maintenance Due   Topic Date Due   ??? TETANUS VACCINE ADULT (11 YEARS AND UP)  02/13/1976   ??? CERVICAL CANCER SCREENING  02/12/1986   ??? BREAST CANCER SCREENING  02/12/2005         HPI:     HPI Comments: Saw dr Sheryle Hailstriph yesterday and said all ok maybe tia  Results for orders placed during the hospital encounter of 07/29/12    -SURGICAL PATHOLOGY     Surgical Pathology Report                           reports reviewed from the er  Ct scan of head and ekg normal and labs are ok      Headache   This is a chronic problem. The current episode started more than 1 month ago. The problem occurs  intermittently. The problem has been unchanged. The pain is located in the bilateral region. The pain does not radiate. The pain quality is similar to prior headaches. The quality of the pain is described as aching. The pain is moderate. Associated symptoms include back pain (in the lower lumbar when this happened  and into the hips sees chiro regualrly) and dizziness (had a spell at work a few months ago and was baddid go to [place] urgent care had an ekg and was neg treated for sinus infection). Pertinent negatives include no neck pain or weakness.       No results found for this basename: laba1c             ( goal A1C is < 7)   No results found for this basename: labmicr     No results found for this basename: ldlcholesterol,  ldlcalc       (goal LDL is <100)   BUN (mg/dL)   Date Value  07/16/2012 12      BP Readings from Last 3 Encounters:   09/01/13 118/74   07/29/12 140/84   07/16/12 135/87          (goal 120/80)    Past Medical History   Diagnosis Date   ??? HTN (hypertension)    ??? Migraine    ??? GERD (gastroesophageal reflux disease)    ??? Endometriosis       Past Surgical History   Procedure Laterality Date   ??? Hysterectomy     ??? Cesarean section     ??? Appendectomy     ??? Pelvic laparoscopy     ??? Cyst removal Right 07/29/12     GANGLION CYST REMOVAL RIGHT WRIST   ??? Wrist ganglion excision  07/29/12     right wrist       Family History   Problem Relation Age of Onset   ??? Cancer Sister      breast   ??? Heart Failure Mother      mi    ??? Hypertension Father        History   Substance Use Topics   ??? Smoking status: Current Every Day Smoker -- 1.00 packs/day   ??? Smokeless tobacco: Not on file   ??? Alcohol Use: Yes      Comment: social      Current Outpatient Prescriptions   Medication Sig Dispense Refill   ??? lisinopril (PRINIVIL;ZESTRIL) 10 MG tablet   Take 10 mg by mouth daily        ??? cyclobenzaprine (FLEXERIL) 10 MG TABS Take 10 mg by mouth every 8 hours as needed  90 tablet  3   ??? hydrochlorothiazide (HYDRODIURIL) 25  MG tablet TAKE ONE TABLET BY MOUTH EVERY DAY  30 tablet  4   ??? omeprazole (PRILOSEC) 20 MG capsule Take 20 mg by mouth daily.         No current facility-administered medications for this visit.     Allergies   Allergen Reactions   ??? Imitrex [Sumatriptan] Other (See Comments)     Bradycardia    ??? Sulfa Antibiotics Hives       Health Maintenance   Topic Date Due   ??? TETANUS VACCINE ADULT (11 YEARS AND UP)  02/13/1976   ??? CERVICAL CANCER SCREENING  02/12/1986   ??? BREAST CANCER SCREENING  02/12/2005   ??? FLU VACCINE YEARLY (ADULT)  10/18/2013       Subjective:      Review of Systems   Constitutional: Negative.    Eyes: Positive for visual disturbance.   Respiratory: Negative.    Cardiovascular: Negative.    Gastrointestinal: Negative.    Musculoskeletal: Positive for back pain (in the lower lumbar when this happened  and into the hips sees chiro regualrly). Negative for myalgias, joint swelling, gait problem and neck pain.   Neurological: Positive for dizziness (had a spell at work a few months ago and was baddid go to [place] urgent care had an ekg and was neg treated for sinus infection) and headaches (chronic). Negative for tremors, syncope, facial asymmetry, speech difficulty and weakness.   Hematological: Negative.    Psychiatric/Behavioral: The patient is nervous/anxious (always high).        Objective:     Physical Exam   Constitutional: She appears well-developed and well-nourished.   BP 118/74    Pulse 80    Temp(Src) 97.6 ??F (36.4 ??C) (Oral)    Ht 5\' 5"  (1.651 m)  Wt 167 lb (75.751 kg)    BMI 27.79 kg/m2      SpO2 98%    LMP 03/17/2008        HENT:   Head: Normocephalic and atraumatic.   Right Ear: External ear normal. Tympanic membrane is not bulging.   Left Ear: External ear normal. Tympanic membrane is not bulging.   Mouth/Throat: Oropharynx is clear and moist and mucous membranes are normal.   Eyes: Conjunctivae are normal. Pupils are equal, round, and reactive to light.   Neck: Normal range of motion.  Neck supple. No thyroid mass and no thyromegaly present.   Cardiovascular: Normal rate, regular rhythm, S1 normal, S2 normal and normal heart sounds.    Pulmonary/Chest: Effort normal and breath sounds normal.   Abdominal: Soft. Normal appearance. There is no tenderness.   Musculoskeletal: Normal range of motion.   Neurological: She is alert. No cranial nerve deficit.   Skin: Skin is dry and intact. No rash noted. No cyanosis. Nails show no clubbing.   Psychiatric: She has a normal mood and affect. Her speech is normal and behavior is normal. Judgment and thought content normal.   Nursing note and vitals reviewed.  carotid bruit is noted on the right  BP 118/74    Pulse 80    Temp(Src) 97.6 ??F (36.4 ??C) (Oral)    Ht 5\' 5"  (1.651 m)    Wt 167 lb (75.751 kg)    BMI 27.79 kg/m2      SpO2 98%    LMP 03/17/2008       Assessment:      1. Diplopia  VL Carotid Bilateral    MRI Brain W WO Contrast   2. Dizziness  VL Carotid Bilateral    MRI Brain W WO Contrast   3. Headache               Plan:    discussed  Issue of what happened  tia vs mia vs stress  Do a mri brain and caroitd--carotid bruit  rec baby asa  Trial of flexeril to see if helps with ha and neck pain  enxourage to stop smoking  Chronic diarrhea--try metamucil tabs    Return in about 3 months (around 12/02/2013), or if symptoms worsen or fail to improve.    Orders Placed This Encounter   Procedures   ??? VL Carotid Bilateral     Standing Status: Future      Number of Occurrences:       Standing Expiration Date: 09/01/2014   ??? MRI Brain W WO Contrast     Standing Status: Future      Number of Occurrences:       Standing Expiration Date: 09/02/2014     Orders Placed This Encounter   Medications   ??? cyclobenzaprine (FLEXERIL) 10 MG TABS     Sig: Take 10 mg by mouth every 8 hours as needed     Dispense:  90 tablet     Refill:  3       Patient given educational materials - see patient instructions.  Discussed use, benefit, and side effects of prescribed medications.  All  patient questions answered. Pt voiced understanding. Reviewed health maintenance.  Instructed to continue current medications, diet and exercise.  Patient agreed with treatment plan. Follow up as directed.     Electronically signed by Severiano Gilbert, DO on 09/01/2013 at 1:05 PM

## 2013-09-03 MED ORDER — LISINOPRIL 10 MG PO TABS
10 MG | ORAL_TABLET | ORAL | Status: DC
Start: 2013-09-03 — End: 2014-03-03

## 2013-09-15 MED ORDER — GADOPENTETATE DIMEGLUMINE 469.01 MG/ML IV SOLN
469.01 | Freq: Once | INTRAVENOUS | Status: AC | PRN
Start: 2013-09-15 — End: 2013-09-15
  Administered 2013-09-15: 18:00:00 16 mL via INTRAVENOUS

## 2013-10-18 MED ORDER — HYDROCHLOROTHIAZIDE 25 MG PO TABS
25 MG | ORAL_TABLET | ORAL | Status: DC
Start: 2013-10-18 — End: 2014-02-27

## 2013-12-07 NOTE — Progress Notes (Signed)
After obtaining consent, and per orders of Dr. BURKE, injection of FLU given by Takhia Spoon, Saladin Petrelli. Patient instructed to remain in clinic for 20 minutes afterwards, and to report any adverse reaction to me immediately.

## 2013-12-07 NOTE — Progress Notes (Signed)
[facility]  [facility]  [address]  [place] [id number]  Dept: [phone]  Dept Fax: [phone]    Yvonne LeapDeborah J King is a 48 y.o. female who presents today for her medical conditions/complaints as noted below.  Yvonne Leapeborah J King is c/o of   Chief Complaint   Patient presents with   ??? Hypertension   ??? Results     MRI OF BRAIN.  PATIENT HAD DOUBLE VISION JUST ONCE.     Have you seen any other physician or provider since your last visit no    Have you had any other diagnostic tests since your last visit? yes - MRI BRAIN    Have you changed or stopped any medications since your last visit including any over-the-counter medicines, vitamins, or herbal medicines? no     Are you taking all your prescribed medications? Yes  If NO, why? -  N/A           Patient Self-Management Goal for this visit.   What is your goal for your visit today? - R/C HTN AND MRI RESULTS   Barriers to success: none   Plan for overcoming my barriers: N/A      Confidence: 10/10   Date goal set: 12/07/13   Date expected to reach goal: 61month    Medical history Review  Past Medical, Family, and Social History reviewed and does contribute to the patient presenting condition    There are no preventive care reminders to display for this patient.      HPI:     HPI Comments: Results for orders placed during the hospital encounter of 07/29/12    -SURGICAL PATHOLOGY     Surgical Pathology Report                         Health Maintenance reviewed - .      Hypertension  This is a chronic problem. The current episode started more than 1 year ago. The problem is unchanged. The problem is controlled. Associated symptoms include headaches (stable and no change) and neck pain. Pertinent negatives include no chest pain, palpitations or shortness of breath. There are no associated agents to hypertension. There are no known risk factors for coronary artery disease. Past treatments include ACE inhibitors. The current  treatment provides moderate improvement. There are no compliance problems.        No results found for: LABA1C          ( goal A1C is < 7)   No results found for: LABMICR  No results found for: LDLCHOLESTEROL, LDLCALC    (goal LDL is <100)   BUN (mg/dL)   Date Value   46/96/295205/30/2014 12     BP Readings from Last 3 Encounters:   12/07/13 126/70   09/01/13 118/74   07/29/12 140/84          (goal 120/80)    Past Medical History   Diagnosis Date   ??? HTN (hypertension)    ??? Migraine    ??? GERD (gastroesophageal reflux disease)    ??? Endometriosis       Past Surgical History   Procedure Laterality Date   ??? Hysterectomy       endometriossi has one ovary   ??? Cesarean section     ??? Appendectomy     ??? Pelvic laparoscopy     ??? Cyst removal Right 07/29/12     GANGLION CYST REMOVAL RIGHT  WRIST   ??? Wrist ganglion excision  07/29/12     right wrist       Family History   Problem Relation Age of Onset   ??? Cancer Sister      breast   ??? Heart Failure Mother      mi    ??? Hypertension Father        History   Substance Use Topics   ??? Smoking status: Current Every Day Smoker -- 1.00 packs/day   ??? Smokeless tobacco: Not on file   ??? Alcohol Use: Yes      Comment: social      Current Outpatient Prescriptions   Medication Sig Dispense Refill   ??? aspirin 81 MG tablet Take 81 mg by mouth daily     ??? hydrochlorothiazide (HYDRODIURIL) 25 MG tablet TAKE ONE TABLET BY MOUTH DAILY 30 tablet 3   ??? lisinopril (PRINIVIL;ZESTRIL) 10 MG tablet TAKE ONE TABLET BY MOUTH EVERY DAY 30 tablet 4   ??? cyclobenzaprine (FLEXERIL) 10 MG TABS Take 10 mg by mouth every 8 hours as needed 90 tablet 3   ??? omeprazole (PRILOSEC) 20 MG capsule Take 20 mg by mouth daily.       No current facility-administered medications for this visit.     Allergies   Allergen Reactions   ??? Imitrex [Sumatriptan] Other (See Comments)     Bradycardia    ??? Sulfa Antibiotics Hives       Health Maintenance   Topic Date Due   ??? FLU VACCINE YEARLY (ADULT)  09/18/2014   ??? BREAST CANCER SCREENING   12/08/2014   ??? CERVICAL CANCER SCREENING  12/07/2016   ??? TETANUS VACCINE ADULT (11 YEARS AND UP)  12/08/2023   ??? PNEUMOVAX 1 DOSE 19-64Y (2) 02/12/2030       Subjective:      Review of Systems   Constitutional: Negative for fever and unexpected weight change.   HENT: Positive for sore throat. Negative for ear pain.    Eyes: Negative for visual disturbance.   Respiratory: Negative for cough and shortness of breath.    Cardiovascular: Negative for chest pain, palpitations and leg swelling.   Gastrointestinal: Negative for nausea, abdominal pain, diarrhea, constipation and blood in stool.   Genitourinary: Negative for urgency, frequency and difficulty urinating.   Musculoskeletal: Positive for joint swelling (feel on knees when bowling), arthralgias and neck pain. Myalgias: sees Scientist, research (medical)chiro regulalry.   Skin: Negative for rash.   Neurological: Positive for headaches (stable and no change). Negative for dizziness.   Psychiatric/Behavioral: Negative for sleep disturbance. The patient is not nervous/anxious.        Objective:     Physical Exam   Constitutional: She appears well-developed and well-nourished.   BP 126/70 mmHg   Pulse 81   Temp(Src) 97.7 ??F (36.5 ??C) (Tympanic)   Ht 5\' 5"  (1.651 m)   Wt 170 lb (77.111 kg)   BMI 28.29 kg/m2   SpO2 98%   LMP 03/17/2008     HENT:   Head: Normocephalic and atraumatic.   Right Ear: External ear normal. Tympanic membrane is not bulging. A middle ear effusion is present.   Left Ear: External ear normal. Tympanic membrane is not bulging. A middle ear effusion is present.   Mouth/Throat: Oropharynx is clear and moist and mucous membranes are normal.   Eyes: Conjunctivae are normal. Pupils are equal, round, and reactive to light.   Neck: Normal range of motion. Neck supple. No thyroid mass and  no thyromegaly present.   Cardiovascular: Normal rate, regular rhythm, S1 normal, S2 normal and normal heart sounds.    Pulmonary/Chest: Effort normal and breath sounds normal.   Abdominal: Soft.  Normal appearance. There is no tenderness.   Musculoskeletal: Normal range of motion. She exhibits tenderness (on the knee to palpate  good rom  ).   Neurological: She is alert. No cranial nerve deficit.   Skin: Skin is dry and intact. No rash noted. No cyanosis. Nails show no clubbing.   Psychiatric: She has a normal mood and affect. Her speech is normal and behavior is normal. Judgment and thought content normal.   Nursing note and vitals reviewed.    BP 126/70 mmHg   Pulse 81   Temp(Src) 97.7 ??F (36.5 ??C) (Tympanic)   Ht 5\' 5"  (1.651 m)   Wt 170 lb (77.111 kg)   BMI 28.29 kg/m2   SpO2 98%   LMP 03/17/2008    Assessment:      1. Essential hypertension     2. Depression screening  Negative Screen for Clinical Depression, Follow-up not Required G8510   3. Well adult exam  MAM Digital Screen Bilateral             Plan:    neg mri brain  Neg carotid  Is feeling better  Say neuro opth 2 times and is good  Health Maintenance reviewed - mammogram ordered, patient to schedule appointment.  i can do paps  Cold-- mointior symptoms  Knee pain is getting be4tter  Return in about 6 months (around 06/08/2014), or if symptoms worsen or fail to improve.    Orders Placed This Encounter   Procedures   ??? MAM Digital Screen Bilateral     Standing Status: Future      Number of Occurrences:       Standing Expiration Date: 02/06/2015   ??? Negative Screen for Clinical Depression, Follow-up not Required G8510     No orders of the defined types were placed in this encounter.       Patient given educational materials - see patient instructions.  Discussed use, benefit, and side effects of prescribed medications.  All patient questions answered. Pt voiced understanding. Reviewed health maintenance.  Instructed to continue current medications, diet and exercise.  Patient agreed with treatment plan. Follow up as directed.     Electronically signed by Severiano Gilbert, DO on 12/07/2013 at 8:50 AM

## 2013-12-07 NOTE — Addendum Note (Signed)
Addended by: Phillips ClimesALKINS, Andrian Sabala on: 12/07/2013 09:08 AM     Modules accepted: Orders

## 2013-12-30 MED ORDER — AZITHROMYCIN 250 MG PO TABS
250 MG | PACK | ORAL | Status: AC
Start: 2013-12-30 — End: 2014-01-09

## 2013-12-30 NOTE — Telephone Encounter (Signed)
PT CALLED REQUESTING AN ANTIBIOTIC, STATES SHE HAS CHEST CONGESTION, SORE THROAT, SINUS DRAINAGE, PLEASE ADVISE THANK YOU, PLEASE CALL PT WHEN DESCION IS MADE.

## 2013-12-30 NOTE — Telephone Encounter (Signed)
Sent a rx over

## 2013-12-31 ENCOUNTER — Inpatient Hospital Stay
Admit: 2013-12-31 | Discharge: 2014-01-09 | Payer: BLUE CROSS/BLUE SHIELD | Attending: Family Medicine | Primary: Family Medicine

## 2013-12-31 DIAGNOSIS — Z0000 Encounter for general adult medical examination without abnormal findings: Secondary | ICD-10-CM

## 2014-02-28 MED ORDER — HYDROCHLOROTHIAZIDE 25 MG PO TABS
25 MG | ORAL_TABLET | ORAL | Status: DC
Start: 2014-02-28 — End: 2014-06-15

## 2014-03-06 MED ORDER — LISINOPRIL 10 MG PO TABS
10 MG | ORAL_TABLET | ORAL | Status: DC
Start: 2014-03-06 — End: 2014-08-10

## 2014-05-04 ENCOUNTER — Other Ambulatory Visit (HOSPITAL_COMMUNITY): Payer: Self-pay | Admitting: Nurse Practitioner

## 2014-05-04 DIAGNOSIS — Z1231 Encounter for screening mammogram for malignant neoplasm of breast: Secondary | ICD-10-CM

## 2014-05-15 ENCOUNTER — Ambulatory Visit (HOSPITAL_COMMUNITY)
Admission: RE | Admit: 2014-05-15 | Discharge: 2014-05-15 | Disposition: A | Discharge status: Home / Self Care | Payer: No Typology Code available for payment source | Source: Ambulatory Visit | Attending: Nurse Practitioner | Admitting: Nurse Practitioner

## 2014-05-15 DIAGNOSIS — Z1231 Encounter for screening mammogram for malignant neoplasm of breast: Secondary | ICD-10-CM | POA: Diagnosis not present

## 2014-06-07 ENCOUNTER — Encounter: Attending: Family Medicine | Primary: Family Medicine

## 2014-06-17 MED ORDER — HYDROCHLOROTHIAZIDE 25 MG PO TABS
25 MG | ORAL_TABLET | ORAL | Status: DC
Start: 2014-06-17 — End: 2014-08-22

## 2014-07-25 ENCOUNTER — Ambulatory Visit (INDEPENDENT_AMBULATORY_CARE_PROVIDER_SITE_OTHER): Payer: Self-pay | Admitting: Internal Medicine

## 2014-07-25 VITALS — BP 98/70 | HR 92 | Temp 97.8°F | Resp 16 | Ht 65.5 in | Wt 152.8 lb

## 2014-07-25 DIAGNOSIS — E119 Type 2 diabetes mellitus without complications: Secondary | ICD-10-CM

## 2014-07-25 NOTE — Progress Notes (Signed)
   Subjective:    Patient ID: [name] [name], female    DOB: 1965-06-30, 49 y.o.   MRN: [id number]  HPI DOT, see form. Controlled diabetes and cholesterol Health conscious   Review of Systems  Constitutional: Negative.   HENT: Negative.   Eyes: Negative.   Respiratory: Negative.   Cardiovascular: Negative.   Gastrointestinal: Negative.   Endocrine: Negative.   Allergic/Immunologic: Negative.   Neurological: Negative.   Hematological: Negative.   Psychiatric/Behavioral: Negative.        Objective:   Physical Exam  Constitutional: She is oriented to person, place, and time. She appears well-developed and well-nourished.  HENT:  Head: Normocephalic.  Right Ear: External ear normal.  Left Ear: External ear normal.  Mouth/Throat: Oropharynx is clear and moist.  Eyes: Conjunctivae and EOM are normal. Scleral icterus is present.  Neck: Normal range of motion. Neck supple.  Cardiovascular: Normal rate, regular rhythm, normal heart sounds and intact distal pulses.   Pulmonary/Chest: Effort normal and breath sounds normal.  Abdominal: Soft. Bowel sounds are normal. She exhibits no mass. There is no tenderness.  Musculoskeletal: Normal range of motion.  Neurological: She is alert and oriented to person, place, and time. No cranial nerve deficit or sensory deficit. She exhibits normal muscle tone. Coordination and gait normal.  Psychiatric: She has a normal mood and affect. Her behavior is normal. Judgment and thought content normal.          Assessment & Plan:  DOT 1 year

## 2014-07-25 NOTE — Progress Notes (Signed)
   Subjective:    Patient ID: [name] [name], female    DOB: 1965/07/16, 49 y.o.   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 [name] [name] causes sugar to come out in her urine   Review of Systems     Objective:   Physical Exam        Assessment & Plan:

## 2014-07-25 NOTE — Patient Instructions (Addendum)
Diabetes and Exercise Exercising regularly is important. It is not just about losing weight. It has many health benefits, such as:  Improving your overall fitness, flexibility, and endurance.  Increasing your bone density.  Helping with weight control.  Decreasing your body fat.  Increasing your muscle strength.  Reducing stress and tension.  Improving your overall health. People with diabetes who exercise gain additional benefits because exercise:  Reduces appetite.  Improves the body's use of blood sugar (glucose).  Helps lower or control blood glucose.  Decreases blood pressure.  Helps control blood lipids (such as cholesterol and triglycerides).  Improves the body's use of the hormone insulin by:  Increasing the body's insulin sensitivity.  Reducing the body's insulin needs.  Decreases the risk for heart disease because exercising:  Lowers cholesterol and triglycerides levels.  Increases the levels of good cholesterol (such as high-density lipoproteins [HDL]) in the body.  Lowers blood glucose levels. YOUR ACTIVITY PLAN  Choose an activity that you enjoy and set realistic goals. Your health care provider or diabetes educator can help you make an activity plan that works for you. Exercise regularly as directed by your health care provider. This includes:  Performing resistance training twice a week such as push-ups, sit-ups, lifting weights, or using resistance bands.  Performing 150 minutes of cardio exercises each week such as walking, running, or playing sports.  Staying active and spending no more than 90 minutes at one time being inactive. Even short bursts of exercise are good for you. Three 10-minute sessions spread throughout the day are just as beneficial as a single 30-minute session. Some exercise ideas include:  Taking the dog for a walk.  Taking the stairs instead of the elevator.  Dancing to your favorite song.  Doing an exercise  video.  Doing your favorite exercise with a friend. RECOMMENDATIONS FOR EXERCISING WITH TYPE 1 OR TYPE 2 DIABETES   Check your blood glucose before exercising. If blood glucose levels are greater than 240 mg/dL, check for urine ketones. Do not exercise if ketones are present.  Avoid injecting insulin into areas of the body that are going to be exercised. For example, avoid injecting insulin into:  The arms when playing tennis.  The legs when jogging.  Keep a record of:  Food intake before and after you exercise.  Expected peak times of insulin action.  Blood glucose levels before and after you exercise.  The type and amount of exercise you have done.  Review your records with your health care provider. Your health care provider will help you to develop guidelines for adjusting food intake and insulin amounts before and after exercising.  If you take insulin or oral hypoglycemic agents, watch for signs and symptoms of hypoglycemia. They include:  Dizziness.  Shaking.  Sweating.  Chills.  Confusion.  Drink plenty of water while you exercise to prevent dehydration or heat stroke. Body water is lost during exercise and must be replaced.  Talk to your health care provider before starting an exercise program to make sure it is safe for you. Remember, almost any type of activity is better than none. Document Released: 04/26/2003 Document Revised: 06/20/2013 Document Reviewed: 07/13/2012 ExitCare Patient Information 2015 [employer]. This information is not intended to replace advice given to you by your health care provider. Make sure you discuss any questions you have with your health care provider. Calorie Counting for Weight Loss Calories are energy you get from the things you eat and drink. Your body uses this   energy to keep you going throughout the day. The number of calories you eat affects your weight. When you eat more calories than your body needs, your body stores  the extra calories as fat. When you eat fewer calories than your body needs, your body burns fat to get the energy it needs. Calorie counting means keeping track of how many calories you eat and drink each day. If you make sure to eat fewer calories than your body needs, you should lose weight. In order for calorie counting to work, you will need to eat the number of calories that are right for you in a day to lose a healthy amount of weight per week. A healthy amount of weight to lose per week is usually 1-2 lb (0.5-0.9 kg). A dietitian can determine how many calories you need in a day and give you suggestions on how to reach your calorie goal.  WHAT IS MY MY PLAN? My goal is to have __________ calories per day.  If I have this many calories per day, I should lose around __________ pounds per week. WHAT DO I NEED TO KNOW ABOUT CALORIE COUNTING? In order to meet your daily calorie goal, you will need to:  Find out how many calories are in each food you would like to eat. Try to do this before you eat.  Decide how much of the food you can eat.  Write down what you ate and how many calories it had. Doing this is called keeping a food log. WHERE DO I FIND CALORIE INFORMATION? The number of calories in a food can be found on a Nutrition Facts label. Note that all the information on a label is based on a specific serving of the food. If a food does not have a Nutrition Facts label, try to look up the calories online or ask your dietitian for help. HOW DO I DECIDE HOW MUCH TO EAT? To decide how much of the food you can eat, you will need to consider both the number of calories in one serving and the size of one serving. This information can be found on the Nutrition Facts label. If a food does not have a Nutrition Facts label, look up the information online or ask your dietitian for help. Remember that calories are listed per serving. If you choose to have more than one serving of a food, you will have to  multiply the calories per serving by the amount of servings you plan to eat. For example, the label on a package of bread might say that a serving size is 1 slice and that there are 90 calories in a serving. If you eat 1 slice, you will have eaten 90 calories. If you eat 2 slices, you will have eaten 180 calories. HOW DO I KEEP A FOOD LOG? After each meal, record the following information in your food log:  What you ate.  How much of it you ate.  How many calories it had.  Then, add up your calories. Keep your food log near you, such as in a small notebook in your pocket. Another option is to use a mobile app or website. Some programs will calculate calories for you and show you how many calories you have left each time you add an item to the log. WHAT ARE SOME CALORIE COUNTING TIPS?  Use your calories on foods and drinks that will fill you up and not leave you hungry. Some examples of this include foods like nuts   and nut butters, vegetables, lean proteins, and high-fiber foods (more than 5 g fiber per serving).  Eat nutritious foods and avoid empty calories. Empty calories are calories you get from foods or beverages that do not have many nutrients, such as candy and soda. It is better to have a nutritious high-calorie food (such as an avocado) than a food with few nutrients (such as a bag of chips).  Know how many calories are in the foods you eat most often. This way, you do not have to look up how many calories they have each time you eat them.  Look out for foods that may seem like low-calorie foods but are really high-calorie foods, such as baked goods, soda, and fat-free candy.  Pay attention to calories in drinks. Drinks such as sodas, specialty coffee drinks, alcohol, and juices have a lot of calories yet do not fill you up. Choose low-calorie drinks like water and diet drinks.  Focus your calorie counting efforts on higher calorie items. Logging the calories in a garden salad that  contains only vegetables is less important than calculating the calories in a milk shake.  Find a way of tracking calories that works for you. Get creative. Most people who are successful find ways to keep track of how much they eat in a day, even if they do not count every calorie. WHAT ARE SOME PORTION CONTROL TIPS?  Know how many calories are in a serving. This will help you know how many servings of a certain food you can have.  Use a measuring cup to measure serving sizes. This is helpful when you start out. With time, you will be able to estimate serving sizes for some foods.  Take some time to put servings of different foods on your favorite plates, bowls, and cups so you know what a serving looks like.  Try not to eat straight from a bag or box. Doing this can lead to overeating. Put the amount you would like to eat in a cup or on a plate to make sure you are eating the right portion.  Use smaller plates, glasses, and bowls to prevent overeating. This is a quick and easy way to practice portion control. If your plate is smaller, less food can fit on it.  Try not to multitask while eating, such as watching TV or using your computer. If it is time to eat, sit down at a table and enjoy your food. Doing this will help you to start recognizing when you are full. It will also make you more aware of what and how much you are eating. HOW CAN I CALORIE COUNT WHEN EATING OUT?  Ask for smaller portion sizes or child-sized portions.  Consider sharing an entree and sides instead of getting your own entree.  If you get your own entree, eat only half. Ask for a box at the beginning of your meal and put the rest of your entree in it so you are not tempted to eat it.  Look for the calories on the menu. If calories are listed, choose the lower calorie options.  Choose dishes that include vegetables, fruits, whole grains, low-fat dairy products, and lean protein. Focusing on smart food choices from  each of the 5 food groups can help you stay on track at restaurants.  Choose items that are boiled, broiled, grilled, or steamed.  Choose water, milk, unsweetened iced tea, or other drinks without added sugars. If you want an alcoholic beverage, choose a lower   calorie option. For example, a regular margarita can have up to 700 calories and a glass of wine has around 150.  Stay away from items that are buttered, battered, fried, or served with cream sauce. Items labeled "crispy" are usually fried, unless stated otherwise.  Ask for dressings, sauces, and syrups on the side. These are usually very high in calories, so do not eat much of them.  Watch out for salads. Many people think salads are a healthy option, but this is often not the case. Many salads come with bacon, fried chicken, lots of cheese, fried chips, and dressing. All of these items have a lot of calories. If you want a salad, choose a garden salad and ask for grilled meats or steak. Ask for the dressing on the side, or ask for olive oil and vinegar or lemon to use as dressing.  Estimate how many servings of a food you are given. For example, a serving of cooked rice is  cup or about the size of half a tennis ball or one cupcake wrapper. Knowing serving sizes will help you be aware of how much food you are eating at restaurants. The list below tells you how big or small some common portion sizes are based on everyday objects.  1 oz--4 stacked dice.  3 oz--1 deck of cards.  1 tsp--1 dice.  1 Tbsp-- a Ping-Pong ball.  2 Tbsp--1 Ping-Pong ball.   cup--1 tennis ball or 1 cupcake wrapper.  1 cup--1 baseball. Document Released: 02/03/2005 Document Revised: 06/20/2013 Document Reviewed: 12/09/2012 ExitCare Patient Information 2015 [employer]. This information is not intended to replace advice given to you by your health care provider. Make sure you discuss any questions you have with your health care provider.  

## 2014-08-10 MED ORDER — LISINOPRIL 10 MG PO TABS
10 MG | ORAL_TABLET | ORAL | 2 refills | Status: DC
Start: 2014-08-10 — End: 2021-05-02

## 2014-08-29 MED ORDER — HYDROCHLOROTHIAZIDE 25 MG PO TABS
25 MG | ORAL_TABLET | ORAL | 0 refills | Status: DC
Start: 2014-08-29 — End: 2014-10-26

## 2014-10-26 MED ORDER — HYDROCHLOROTHIAZIDE 25 MG PO TABS
25 MG | ORAL_TABLET | ORAL | 0 refills | Status: AC
Start: 2014-10-26 — End: 2021-04-30

## 2014-10-26 NOTE — Telephone Encounter (Signed)
LV 12/07/13

## 2015-04-30 ENCOUNTER — Other Ambulatory Visit (HOSPITAL_COMMUNITY): Payer: Self-pay | Admitting: *Deleted

## 2015-04-30 ENCOUNTER — Other Ambulatory Visit (HOSPITAL_COMMUNITY): Payer: Self-pay | Admitting: Family Medicine

## 2015-04-30 DIAGNOSIS — Z1231 Encounter for screening mammogram for malignant neoplasm of breast: Secondary | ICD-10-CM

## 2015-05-16 ENCOUNTER — Ambulatory Visit (HOSPITAL_COMMUNITY): Payer: No Typology Code available for payment source | Attending: Family Medicine

## 2015-05-16 DIAGNOSIS — M2560 Stiffness of unspecified joint, not elsewhere classified: Secondary | ICD-10-CM | POA: Insufficient documentation

## 2015-05-16 DIAGNOSIS — R29898 Other symptoms and signs involving the musculoskeletal system: Secondary | ICD-10-CM

## 2015-05-16 DIAGNOSIS — M1612 Unilateral primary osteoarthritis, left hip: Secondary | ICD-10-CM | POA: Insufficient documentation

## 2015-05-16 DIAGNOSIS — M25552 Pain in left hip: Secondary | ICD-10-CM | POA: Diagnosis present

## 2015-05-16 DIAGNOSIS — M6289 Other specified disorders of muscle: Secondary | ICD-10-CM | POA: Diagnosis present

## 2015-05-16 DIAGNOSIS — M25551 Pain in right hip: Secondary | ICD-10-CM | POA: Insufficient documentation

## 2015-05-16 DIAGNOSIS — R269 Unspecified abnormalities of gait and mobility: Secondary | ICD-10-CM | POA: Diagnosis present

## 2015-05-16 DIAGNOSIS — IMO0002 Reserved for concepts with insufficient information to code with codable children: Secondary | ICD-10-CM

## 2015-05-16 NOTE — Patient Instructions (Signed)
   STRAIGHT LEG RAISE - SLR  While lying or sitting, raise up your leg with a straight knee.  Keep the opposite knee bent with the foot planted to the ground.  1 set of 10 reps, 2x/day          SLR - Hip Extension  Lie on stomach.  Tighten the core muscles and glutes, then lift the leg keeping the knee locked.  Raise 2-6 inches.  Stop if the trunk arches or twists.  1 set of 10 reps, 2x/day          SLR abduction   Lie down on your side. Bend your bottom leg while keeping the top leg straight. Your top leg should be in line with your torso. Raise that leg up off the table and back down. Make sure that you don't lead the lift with your toe, but keep the outside of your foot flat and parellel with the ceiling. You should feel the muscle in your outer hip working.  1 set of 10 reps, 2x/day

## 2015-05-16 NOTE — Therapy (Addendum)
[facility] [address] [place], [place], [address] Phone: [phone]   Fax:  [phone]  Physical Therapy Evaluation  Patient Details  Name: [name] [name] MRN: [id number] Date of Birth: October 07, 1965 Referring Provider: Dr. Orvilla Fus   Encounter Date: 05/16/2015      PT End of Session - 05/16/15 1150    Visit Number 1   Number of Visits 13   Date for PT Re-Evaluation 06/15/15   Authorization Type [facility] Choice   Authorization Time Period 05/16/2015 to 06/27/2015 ( complete re-assessment at 8th visit)   Authorization - Visit Number 1   Authorization - Number of Visits 8   PT Start Time [phone]   PT Stop Time 1016   PT Time Calculation (min) 45 min   Activity Tolerance Patient tolerated treatment well   Behavior During Therapy [facility] for tasks assessed/performed      Past Medical History  Diagnosis Date  . Hyperlipidemia   . Diabetes mellitus without complication   . Allergy   . GERD (gastroesophageal reflux disease)     Past Surgical History  Procedure Laterality Date  . Eye surgery      There were no vitals filed for this visit.  Visit Diagnosis:  Primary osteoarthritis of left hip - Plan: PT plan of care cert/re-cert  Pain of both hip joints - Plan: PT plan of care cert/re-cert  Weakness of both hips - Plan: PT plan of care cert/re-cert  Stiffness of joint of lower extremity - Plan: PT plan of care cert/re-cert  Abnormality of gait - Plan: PT plan of care cert/re-cert      Subjective Assessment - 05/16/15 0940    Subjective [name] [name] is 50 yo female who c/o B hip pain and limited mobility of B hips that insidiously began ~ 2 years ago. Pt reported that she has difficulty with crossing her legs and feels as though her hips "are really stiff". In addition, she limited with long distance ambulation and prolonged standing secondary to B hip pain. She reported having a recent x-ray to her L hip, which indicated  "degeneration". Pt reported hx of falls, but none in the last 6 months. 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    Pertinent History DM and BPPV/vertigo    Limitations Walking;Standing   How long can you stand comfortably? 1 hour    How long can you walk comfortably? Unknown    Diagnostic tests x-rays= degeneration of the L hip    Patient Stated Goals Pt's goal is to reduce B hip pain and improve her hip mobility.    Currently in Pain? Yes   Pain Score 7   B hip pain ranges between a 3-7/10 on a VAS    Pain Location Hip   Pain Orientation Right;Left   Pain Descriptors / Indicators Aching;Dull   Pain Type Chronic pain   Pain Radiating Towards None    Pain Onset More than a month ago  ~2 years ago    Pain Frequency Constant   Aggravating Factors  Attmepting to sit with legs crossed, long distance ambulation    Pain Relieving Factors Ibuprofen and shifting weight to contralateral LE    Effect of Pain on Daily Activities limited with work related activities             [facility] PT Assessment - 05/16/15 0001    Assessment   Medical Diagnosis B hip pain / hip OA  Referring Provider Dr. Orvilla Fus    Onset Date/Surgical Date 04/17/13   Hand Dominance Right   Next [facility]= August 2017   Prior Therapy None for her current complaints   Precautions   Precautions None   Restrictions   Weight Bearing Restrictions No   Balance Screen   Has the patient fallen in the past 6 months No   Has the patient had a decrease in activity level because of a fear of falling?  No   Is the patient reluctant to leave their home because of a fear of falling?  No   Prior Function   Level of Independence Independent   Cognition   Overall Cognitive Status Within Functional Limits for tasks assessed   Observation/Other Assessments   Focus on Therapeutic Outcomes (FOTO)  40 % limited    Sensation   Light Touch Appears Intact   ROM / Strength   AROM /  PROM / Strength AROM;Strength;PROM   AROM   AROM Assessment Site Hip;Knee   Right/Left Hip Right;Left   Right Hip Extension 10   Right Hip Flexion 120   Right Hip External Rotation  23   Right Hip Internal Rotation  24   Right Hip ABduction 28   Left Hip Extension 10   Left Hip Flexion 120   Left Hip External Rotation  27   Left Hip Internal Rotation  25   Left Hip ABduction 26   Right/Left Knee --  WNL    PROM   PROM Assessment Site Hip   Strength   Strength Assessment Site Knee;Hip;Ankle   Right/Left Hip Right;Left   Right Hip Flexion 3+/5   Right Hip Extension 3+/5   Right Hip ABduction 3/5   Right Hip ADduction 3+/5   Left Hip Flexion 3+/5   Left Hip Extension 3+/5   Left Hip ABduction 3/5   Left Hip ADduction 3+/5   Right/Left Knee Right;Left   Right Knee Flexion 4-/5   Right Knee Extension 4+/5   Left Knee Flexion 4-/5   Left Knee Extension 4+/5   Right/Left Ankle Right;Left   Right Ankle Dorsiflexion 5/5   Left Ankle Dorsiflexion 5/5   Flexibility   Soft Tissue Assessment /Muscle Length yes   Hamstrings HS 90/90 (L/R)= -44 degrees /-38 degrees    Quadriceps Thomas test= Positive for moderate tightness of B quads/hip flexors    ITB Obers=Postive for tightness, bilaterally   Palpation   Palpation comment tenderness to palpation of bilateral glute med/max   Special Tests    Special Tests Hip Special Tests   Hip Special Tests  Hip Scouring   Hip Scouring   Findings Positive   Side Left   Ambulation/Gait   Ambulation/Gait Yes   Ambulation/Gait Assistance 7: Independent   Ambulation Distance (Feet) 150 Feet   Assistive device None   Gait Pattern Within Functional Limits;Trendelenburg   Ambulation Surface Level                   [facility] Adult PT Treatment/Exercise - 05/16/15 0001    Exercises   Exercises Knee/Hip   Knee/Hip Exercises: Supine   Straight Leg Raises Both;1 set;10 reps;Strengthening   Knee/Hip Exercises: Sidelying   Hip ABduction  Both;Strengthening;1 set;10 reps   Knee/Hip Exercises: Prone   Straight Leg Raises Both;Strengthening;1 set;10 reps                PT Education - 05/16/15 1149    Education provided Yes  Education Details PT eval findings, use of moist heat therapy/ice pack for pain management ( 15-20 minutes, 3-4x/day, with use of skin barrier), and initial HEP    Person(s) Educated Patient   Methods Explanation;Demonstration;Handout   Comprehension Verbalized understanding;Returned demonstration;Need further instruction          PT Short Term Goals - 05/16/15 1203    PT SHORT TERM GOAL #1   Title Patient will independently verbalize and demo proper completion of her initial HEP to continue with LE strengthening at home.    Time 2   Period Weeks   Status New   PT SHORT TERM GOAL #2   Title Patient will report decreased max B hip pain to 6/10 on a VAS in order to improve her ability to ambulate in the community with less limitations.    Time 3   Period Weeks   Status New   PT SHORT TERM GOAL #3   Title Patient will demo improved B hip AROM/PROM by 5-10 degrees in all directions in order to improve B hip mobility and ROM with stair climbing and leisure activities.     Time 3   Period Weeks   Status New           PT Long Term Goals - 05/16/15 1206    PT LONG TERM GOAL #1   Title Patient will independently verbalize and demo proper completion of her advanced HEP to continue with LE strengthening at home once DC.    Time 6   Period Weeks   Status New   PT LONG TERM GOAL #2   Title Patient will demo improved B hip AROM/PROM by 10-15 degrees in all directions in order to improve B hip mobility in order to sit with legs crossed and be able to donn/doff socks/shoes with less difficulty.    Time 6   Period Weeks   Status New   PT LONG TERM GOAL #3   Title Patient will demo improved B hip strength to >4/5 MMT in order to improve performance with outdoor ambulation and transfers.    Time 6   Period Weeks   Status New   PT LONG TERM GOAL #4   Title Patient will improve her FOTO to <30% in order to progress towards her PLOF with leisure activities.    Time 6   Period Weeks   PT LONG TERM GOAL #5   Title Patient will report decreased B hip pain to 0-4/10 on a VAS in order to improve her ability to ambulate in the community with less limitations.    Time 6   Period Weeks   Status New               Plan - 05/16/15 1153    Clinical Impression Statement [name] [name] is a 50 yo female who was referred to outpatient PT secondary to L hip pain and decreased mobility associated with OA. In addition, pt c/o R hip pain and decreased mobility. The pt presents with signs and symptoms that are consistent with referring MD diagnosis of hip OA. Upon examination, the pt presents with significant B hip weakness, impaired B hip AROM/PROM, impaired flexibility of B HS/hip flexors/quads/piriformis/ITB, and B lateral hip pain. Pt had a positive hip scour test of the L hip. No significant gait deviations assessed, however, slight Trendelenberg assessed, bilaterally. The pt would benefit from skilled PT for 2x/week for 6 weeks to improve B hip strength, ROM, and flexibility in order to return to  recreational activities with less pain and limitations. The pt has been authorized for 8 visits at this time. [name] [name] is in agreement with proposed PT POC and frequency.    Pt will benefit from skilled therapeutic intervention in order to improve on the following deficits Abnormal gait;Decreased strength;Difficulty walking;Impaired flexibility;Hypomobility;Decreased range of motion;Pain   Rehab Potential Good   PT Frequency 2x / week   PT Duration 6 weeks   PT Treatment/Interventions ADLs/Self Care Home Management;Cryotherapy;Radiation protection practitionerlectrical Stimulation;Moist Heat;Ultrasound;Gait training;Stair training;Therapeutic activities;Therapeutic exercise;Balance training;Neuromuscular re-education;Patient/family  education;Manual techniques;Passive range of motion;Taping   PT Next Visit Plan Next visit to focus on B hip strengthening ( SLR x 4), HS/piriformis/ITB stretches, B hip PROM, gentle long axis hip distraction, and modalities as needed for pain    PT Home Exercise Plan Initiated PT HEP this visit with addition of B hip SLR into flexion, extension, and abduction. Add adduction and HS stretch to HEP at next PT visit.    Recommended Other Services None at this time    Consulted and Agree with Plan of Care Patient         Problem List Patient Active Problem List   Diagnosis Date Noted  . DIABETES MELLITUS 04/29/2007  . HYPERCHOLESTEROLEMIA 04/29/2007  . ASTHMA 04/29/2007  . ALLERGY 04/29/2007    [name] [name], PT, DPT   05/16/2015, 12:26 PM   G-Codes for 05/16/2015 Mobility and Walking Around Current CK Goal CJ Based on FOTO score   [name] [name] PT, DPT [phone]   [facility] [address] [place], [place], [address] Phone: [phone]   Fax:  [phone]  Name: [name] [name] MRN: [id number] Date of Birth: 1965/06/05

## 2015-05-18 ENCOUNTER — Telehealth (HOSPITAL_COMMUNITY): Payer: Self-pay

## 2015-05-18 ENCOUNTER — Ambulatory Visit (HOSPITAL_COMMUNITY)
Admission: RE | Admit: 2015-05-18 | Discharge: 2015-05-18 | Disposition: A | Discharge status: Home / Self Care | Payer: No Typology Code available for payment source | Source: Ambulatory Visit | Attending: Family Medicine | Admitting: Family Medicine

## 2015-05-18 DIAGNOSIS — Z1231 Encounter for screening mammogram for malignant neoplasm of breast: Secondary | ICD-10-CM | POA: Diagnosis not present

## 2015-05-18 NOTE — Telephone Encounter (Signed)
Faxed Eval progress note and verification of future appointments. NF 05/18/15

## 2015-05-24 ENCOUNTER — Ambulatory Visit (HOSPITAL_COMMUNITY): Payer: No Typology Code available for payment source | Attending: Family Medicine | Admitting: Physical Therapy

## 2015-05-24 DIAGNOSIS — M6281 Muscle weakness (generalized): Secondary | ICD-10-CM | POA: Diagnosis present

## 2015-05-24 DIAGNOSIS — M1612 Unilateral primary osteoarthritis, left hip: Secondary | ICD-10-CM | POA: Insufficient documentation

## 2015-05-24 DIAGNOSIS — R2689 Other abnormalities of gait and mobility: Secondary | ICD-10-CM | POA: Insufficient documentation

## 2015-05-24 DIAGNOSIS — M25652 Stiffness of left hip, not elsewhere classified: Secondary | ICD-10-CM | POA: Diagnosis present

## 2015-05-24 DIAGNOSIS — M25551 Pain in right hip: Secondary | ICD-10-CM | POA: Diagnosis present

## 2015-05-24 DIAGNOSIS — M25651 Stiffness of right hip, not elsewhere classified: Secondary | ICD-10-CM | POA: Insufficient documentation

## 2015-05-24 DIAGNOSIS — M25552 Pain in left hip: Secondary | ICD-10-CM | POA: Insufficient documentation

## 2015-05-24 NOTE — Therapy (Signed)
[facility] [address] [place], [place], [address] Phone: [phone]   Fax:  [phone]  Physical Therapy Treatment  Patient Details  Name: [name] [name] MRN: [id number] Date of Birth: 01-01-1966 Referring Provider: Dr. Orvilla FusSoheir Boshra   Encounter Date: 05/24/2015      PT End of Session - 05/24/15 0950    Visit Number 1   Number of Visits 13   Date for PT Re-Evaluation 06/15/15   Authorization Type [facility] Choice   Authorization Time Period 05/16/2015 to 06/27/2015 ( complete re-assessment at 8th visit)   Authorization - Visit Number 1   Authorization - Number of Visits 8   PT Start Time 0845   PT Stop Time 0930   PT Time Calculation (min) 45 min   Activity Tolerance Patient tolerated treatment well   Behavior During Therapy [facility] for tasks assessed/performed      Past Medical History  Diagnosis Date  . Hyperlipidemia   . Diabetes mellitus without complication   . Allergy   . GERD (gastroesophageal reflux disease)     Past Surgical History  Procedure Laterality Date  . Eye surgery      There were no vitals filed for this visit.  Visit Diagnosis:  Primary osteoarthritis of left hip - Plan: PT plan of care cert/re-cert  Pain in left hip - Plan: PT plan of care cert/re-cert  Muscle weakness (generalized) - Plan: PT plan of care cert/re-cert  Stiffness of right hip, not elsewhere classified - Plan: PT plan of care cert/re-cert  Other abnormalities of gait and mobility - Plan: PT plan of care cert/re-cert  Stiffness of left hip, not elsewhere classified - Plan: PT plan of care cert/re-cert  Pain in right hip - Plan: PT plan of care cert/re-cert      Subjective Assessment - 05/24/15 0851    Subjective Pt states she has a bad night last night, specifically with her L hip and knee. She rates her L hip and knee pain a 6/10 aching pain.   Pertinent History DM and BPPV/vertigo    Currently in Pain? Yes   Pain Score 6    Pain  Location Knee   Pain Orientation Left   Pain Descriptors / Indicators Aching;Dull   Pain Type Chronic pain   Pain Onset More than a month ago   Pain Frequency Constant                         [facility] Adult PT Treatment/Exercise - 05/24/15 0001    Knee/Hip Exercises: Stretches   Active Hamstring Stretch Both;3 reps;30 seconds   Active Hamstring Stretch Limitations seated   Piriformis Stretch Both;3 reps;30 seconds   Piriformis Stretch Limitations seated   Knee/Hip Exercises: Supine   Bridges with Clamshell Strengthening;Both;2 sets;10 reps   Knee/Hip Exercises: Sidelying   Hip ABduction Both;1 set;10 reps   Hip ABduction Limitations against wall    Manual Therapy   Manual Therapy Soft tissue mobilization   Manual therapy comments performed separately from all other interventions   Soft tissue mobilization trigger point release, palmar spreading to B hip adductors                 PT Education - 05/24/15 0949    Education provided Yes   Education Details updated HEP with bridge, sidelying hip abd against wall and hip add stretch   Person(s) Educated Patient   Methods Explanation;Demonstration;Handout   Comprehension Verbalized understanding;Returned demonstration  PT Short Term Goals - 05/16/15 1203    PT SHORT TERM GOAL #1   Title Patient will independently verbalize and demo proper completion of her initial HEP to continue with LE strengthening at home.    Time 2   Period Weeks   Status New   PT SHORT TERM GOAL #2   Title Patient will report decreased max B hip pain to 6/10 on a VAS in order to improve her ability to ambulate in the community with less limitations.    Time 3   Period Weeks   Status New   PT SHORT TERM GOAL #3   Title Patient will demo improved B hip AROM/PROM by 5-10 degrees in all directions in order to improve B hip mobility and ROM with stair climbing and leisure activities.     Time 3   Period Weeks   Status New            PT Long Term Goals - 05/16/15 1206    PT LONG TERM GOAL #1   Title Patient will independently verbalize and demo proper completion of her advanced HEP to continue with LE strengthening at home once DC.    Time 6   Period Weeks   Status New   PT LONG TERM GOAL #2   Title Patient will demo improved B hip AROM/PROM by 10-15 degrees in all directions in order to improve B hip mobility in order to sit with legs crossed and be able to donn/doff socks/shoes with less difficulty.    Time 6   Period Weeks   Status New   PT LONG TERM GOAL #3   Title Patient will demo improved B hip strength to >4/5 MMT in order to improve performance with outdoor ambulation and transfers.   Time 6   Period Weeks   Status New   PT LONG TERM GOAL #4   Title Patient will improve her FOTO to <30% in order to progress towards her PLOF with leisure activities.    Time 6   Period Weeks   PT LONG TERM GOAL #5   Title Patient will report decreased B hip pain to 0-4/10 on a VAS in order to improve her ability to ambulate in the community with less limitations.    Time 6   Period Weeks   Status New               Plan - 05/24/15 1610    Clinical Impression Statement Today's session focused on manual treatment to decrease pain and promote relaxation of B hip adductors as well as LE strengthening therex. Therapist noting trigger points and soft tissue restrictions all through her hip adductors which she reported some mild relief after manual treatment. Will continue current POC.   Pt will benefit from skilled therapeutic intervention in order to improve on the following deficits Abnormal gait;Decreased strength;Difficulty walking;Impaired flexibility;Hypomobility;Decreased range of motion;Pain   Rehab Potential Good   PT Frequency 2x / week   PT Duration 6 weeks   PT Treatment/Interventions ADLs/Self Care Home Management;Cryotherapy;Doctor, hospital;Therapeutic activities;Therapeutic exercise;Balance training;Neuromuscular re-education;Patient/family education;Manual techniques;Passive range of motion;Taping   PT Next Visit Plan Continue to address B hip strengthening, HS/piriformis/ITB stretches, B hip PROM, gentle long axis hip distraction, and modalities as needed for pain    PT Home Exercise Plan Initiated PT HEP this visit with addition of B hip SLR into flexion, extension, and abduction. Added hip adductor stretch, and bridge this session   Consulted  and Agree with Plan of Care Patient        Problem List Patient Active Problem List   Diagnosis Date Noted  . DIABETES MELLITUS 04/29/2007  . HYPERCHOLESTEROLEMIA 04/29/2007  . ASTHMA 04/29/2007  . ALLERGY 04/29/2007   12:37 PM,05/24/2015 Marylyn Ishihara PT, DPT [facility] [phone]  [facility] [place] [address] [place], [place], [address] Phone: [phone]   Fax:  [phone]  Name: [name] [name] MRN: [id number] Date of Birth: Oct 28, 1965

## 2015-05-29 ENCOUNTER — Ambulatory Visit (HOSPITAL_COMMUNITY): Payer: No Typology Code available for payment source | Admitting: Physical Therapy

## 2015-05-29 DIAGNOSIS — R2689 Other abnormalities of gait and mobility: Secondary | ICD-10-CM

## 2015-05-29 DIAGNOSIS — M25552 Pain in left hip: Secondary | ICD-10-CM

## 2015-05-29 DIAGNOSIS — M25652 Stiffness of left hip, not elsewhere classified: Secondary | ICD-10-CM

## 2015-05-29 DIAGNOSIS — M1612 Unilateral primary osteoarthritis, left hip: Secondary | ICD-10-CM | POA: Diagnosis not present

## 2015-05-29 DIAGNOSIS — M25551 Pain in right hip: Secondary | ICD-10-CM

## 2015-05-29 DIAGNOSIS — M6281 Muscle weakness (generalized): Secondary | ICD-10-CM

## 2015-05-29 DIAGNOSIS — M25651 Stiffness of right hip, not elsewhere classified: Secondary | ICD-10-CM

## 2015-05-29 NOTE — Therapy (Signed)
[facility] [address] [place], [place], [address] Phone: [phone]   Fax:  [phone]  Physical Therapy Treatment  Patient Details  Name: [name] [name] MRN: [id number] Date of Birth: 10/31/1965 Referring Provider: Dr. Orvilla Fus   Encounter Date: 05/29/2015      PT End of Session - 05/29/15 1148    Visit Number 2   Number of Visits 13   Date for PT Re-Evaluation 06/15/15   Authorization Type [employer] Choice   Authorization Time Period 05/16/2015 to 06/27/2015 ( complete re-assessment at 8th visit)   Authorization - Visit Number 2   Authorization - Number of Visits 8   PT Start Time [phone]   PT Stop Time 1031   PT Time Calculation (min) 44 min   Activity Tolerance Patient tolerated treatment well   Behavior During Therapy [facility] for tasks assessed/performed      Past Medical History  Diagnosis Date  . Hyperlipidemia   . Diabetes mellitus without complication   . Allergy   . GERD (gastroesophageal reflux disease)     Past Surgical History  Procedure Laterality Date  . Eye surgery      There were no vitals filed for this visit.      Subjective Assessment - 05/29/15 0951    Subjective Patient repots she is feeling just OK today, did not do a lot of HEP to the last couple days due to illness and her sister's birthday celebration; has had some change in her diabetes medicine. Having some pain today, was really aching yesterday.    Pertinent History DM and BPPV/vertigo    Patient Stated Goals Pt's goal is to reduce B hip pain and improve her hip mobility.    Currently in Pain? Yes   Pain Score 1    Pain Location Other (Comment)  general aching pain    Pain Orientation Other (Comment)  general pain    Pain Descriptors / Indicators Aching;Dull   Pain Type Chronic pain   Pain Radiating Towards none    Pain Onset More than a month ago   Pain Frequency Constant   Aggravating Factors  rain, doing too much walking    Pain  Relieving Factors not sure    Effect of Pain on Daily Activities has not been able to go back to work                          [facility] Adult PT Treatment/Exercise - 05/29/15 0001    Knee/Hip Exercises: Stretches   Active Hamstring Stretch Both;3 reps;30 seconds   Active Hamstring Stretch Limitations seated   Piriformis Stretch Both;3 reps;30 seconds   Piriformis Stretch Limitations firgure 4 supine with towel    Gastroc Stretch Both;3 reps;30 seconds   Gastroc Stretch Limitations gastroc on slantboard    Knee/Hip Exercises: Supine   Bridges Limitations 2x10   Straight Leg Raises Both;2 sets;10 reps   Straight Leg Raises Limitations 1#    Other Supine Knee/Hip Exercises supine hip ABD with red TB 2x10   Knee/Hip Exercises: Prone   Hamstring Curl 10 reps;2 sets   Hamstring Curl Limitations 1#   Straight Leg Raises Both;2 sets;10 reps   Straight Leg Raises Limitations 1#   Manual Therapy   Manual Therapy Manual Traction   Manual therapy comments performed separately from all other interventions   Manual Traction 4x15 seconds each LE to relieve pressure/pain in hips  PT Education - 05/29/15 1148    Education provided Yes   Education Details improtance of functional strength in relieving hip pain    Person(s) Educated Patient   Methods Explanation   Comprehension Verbalized understanding          PT Short Term Goals - 05/16/15 1203    PT SHORT TERM GOAL #1   Title Patient will independently verbalize and demo proper completion of her initial HEP to continue with LE strengthening at home.    Time 2   Period Weeks   Status New   PT SHORT TERM GOAL #2   Title Patient will report decreased max B hip pain to 6/10 on a VAS in order to improve her ability to ambulate in the community with less limitations.    Time 3   Period Weeks   Status New   PT SHORT TERM GOAL #3   Title Patient will demo improved B hip AROM/PROM by 5-10 degrees in all  directions in order to improve B hip mobility and ROM with stair climbing and leisure activities.     Time 3   Period Weeks   Status New           PT Long Term Goals - 05/16/15 1206    PT LONG TERM GOAL #1   Title Patient will independently verbalize and demo proper completion of her advanced HEP to continue with LE strengthening at home once DC.    Time 6   Period Weeks   Status New   PT LONG TERM GOAL #2   Title Patient will demo improved B hip AROM/PROM by 10-15 degrees in all directions in order to improve B hip mobility in order to sit with legs crossed and be able to donn/doff socks/shoes with less difficulty.    Time 6   Period Weeks   Status New   PT LONG TERM GOAL #3   Title Patient will demo improved B hip strength to >4/5 MMT in order to improve performance with outdoor ambulation and transfers.   Time 6   Period Weeks   Status New   PT LONG TERM GOAL #4   Title Patient will improve her FOTO to <30% in order to progress towards her PLOF with leisure activities.    Time 6   Period Weeks   PT LONG TERM GOAL #5   Title Patient will report decreased B hip pain to 0-4/10 on a VAS in order to improve her ability to ambulate in the community with less limitations.    Time 6   Period Weeks   Status New               Plan - 05/29/15 1149    Clinical Impression Statement Began session with functional stretching and proceeded to continue with proximal muscle strengthening today, adding 1# weights to increase difficulty of task. Patient did require cues for proper performance of exercises, however was ultimpately able to complete all with good form. 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 Patient reported muscular fatigue at end of session today.    Rehab Potential Good   PT  Frequency 2x / week   PT Duration 6 weeks   PT Treatment/Interventions ADLs/Self Care Home Management;Cryotherapy;Radiation protection practitioner;Therapeutic activities;Therapeutic exercise;Balance training;Neuromuscular re-education;Patient/family education;Manual techniques;Passive range of motion;Taping   PT  Next Visit Plan Continue to address B hip strengthening, HS/piriformis/ITB stretches, B hip PROM, gentle long axis hip distraction, and modalities as needed for pain    PT Home Exercise Plan Initiated PT HEP this visit with addition of B hip SLR into flexion, extension, and abduction. Added hip adductor stretch, and bridge this session   Consulted and Agree with Plan of Care Patient      Patient will benefit from skilled therapeutic intervention in order to improve the following deficits and impairments:  Abnormal gait, Decreased strength, Difficulty walking, Impaired flexibility, Hypomobility, Decreased range of motion, Pain  Visit Diagnosis: Pain in left hip  Muscle weakness (generalized)  Stiffness of right hip, not elsewhere classified  Other abnormalities of gait and mobility  Stiffness of left hip, not elsewhere classified  Pain in right hip     Problem List Patient Active Problem List   Diagnosis Date Noted  . DIABETES MELLITUS 04/29/2007  . HYPERCHOLESTEROLEMIA 04/29/2007  . ASTHMA 04/29/2007  . ALLERGY 04/29/2007    Nedra HaiKristen Jora Galluzzo PT, DPT [phone]  [facility] [address] [place], [place], [address] Phone: [phone]   Fax:  [phone]  Name: [name] [name] MRN: [id number] Date of Birth: 11-29-1965

## 2015-05-31 ENCOUNTER — Ambulatory Visit (HOSPITAL_COMMUNITY): Payer: No Typology Code available for payment source | Admitting: Physical Therapy

## 2015-05-31 DIAGNOSIS — M1612 Unilateral primary osteoarthritis, left hip: Secondary | ICD-10-CM | POA: Diagnosis not present

## 2015-05-31 DIAGNOSIS — M25552 Pain in left hip: Secondary | ICD-10-CM

## 2015-05-31 DIAGNOSIS — M6281 Muscle weakness (generalized): Secondary | ICD-10-CM

## 2015-05-31 DIAGNOSIS — M25652 Stiffness of left hip, not elsewhere classified: Secondary | ICD-10-CM

## 2015-05-31 DIAGNOSIS — M25551 Pain in right hip: Secondary | ICD-10-CM

## 2015-05-31 DIAGNOSIS — M25651 Stiffness of right hip, not elsewhere classified: Secondary | ICD-10-CM

## 2015-05-31 DIAGNOSIS — R2689 Other abnormalities of gait and mobility: Secondary | ICD-10-CM

## 2015-05-31 NOTE — Therapy (Signed)
[facility] [address] [place], [place], [address] Phone: [phone]   Fax:  [phone]  Physical Therapy Treatment  Patient Details  Name: [name] [name] MRN: [id number] Date of Birth: Jan 18, 1966 Referring Provider: Dr. Orvilla FusSoheir Boshra   Encounter Date: 05/31/2015      PT End of Session - 05/31/15 1114    Visit Number 3   Number of Visits 13   Date for PT Re-Evaluation 06/15/15   Authorization Type VA Choice   Authorization Time Period 05/16/2015 to 06/27/2015 ( complete re-assessment at 8th visit)   Authorization - Visit Number 3   Authorization - Number of Visits 8   PT Start Time 1049  pt arrived late   PT Stop Time 1117   PT Time Calculation (min) 28 min   Activity Tolerance Patient tolerated treatment well   Behavior [facility]/performed      Past Medical History  Diagnosis Date  . Hyperlipidemia   . Diabetes mellitus without complication   . Allergy   . GERD (gastroesophageal reflux disease)     Past Surgical History  Procedure Laterality Date  . Eye surgery      There were no vitals filed for this visit.      Subjective Assessment - 05/31/15 1040    Subjective Pt states she has been busy lately. She notes her back and hips were sore after her last session. No pain currently.   Pertinent History DM and BPPV/vertigo    Limitations Walking;Standing   Currently in Pain? No/denies                         [facility] Adult PT Treatment/Exercise - 05/31/15 0001    Knee/Hip Exercises: Stretches   Hip Flexor Stretch 2 reps;Both;20 seconds   Piriformis Stretch Both;3 reps;30 seconds   Piriformis Stretch Limitations supine   Knee/Hip Exercises: Supine   Bridges Limitations 2x10  green TB   Knee/Hip Exercises: Sidelying   Hip ABduction Both;2 sets;15 reps   Hip ABduction Limitations 1# with second set x10 reps                PT Education - 05/31/15 1113    Education provided Yes   Education Details reviewed HEP and addition of hip flexor stretch   Person(s) Educated Patient   Methods Explanation;Demonstration   Comprehension Verbalized understanding;Returned demonstration          PT Short Term Goals - 05/16/15 1203    PT SHORT TERM GOAL #1   Title Patient will independently verbalize and demo proper completion of her initial HEP to continue with LE strengthening at home.    Time 2   Period Weeks   Status New   PT SHORT TERM GOAL #2   Title Patient will report decreased max B hip pain to 6/10 on a VAS in order to improve her ability to ambulate in the community with less limitations.    Time 3   Period Weeks   Status New   PT SHORT TERM GOAL #3   Title Patient will demo improved B hip AROM/PROM by 5-10 degrees in all directions in order to improve B hip mobility and ROM with stair climbing and leisure activities.     Time 3   Period Weeks   Status New           PT Long Term Goals - 05/16/15 1206    PT LONG  TERM GOAL #1   Title Patient will independently verbalize and demo proper completion of her advanced HEP to continue with LE strengthening at home once DC.    Time 6   Period Weeks   Status New   PT LONG TERM GOAL #2   Title Patient will demo improved B hip AROM/PROM by 10-15 degrees in all directions in order to improve B hip mobility in order to sit with legs crossed and be able to donn/doff socks/shoes with less difficulty.    Time 6   Period Weeks   Status New   PT LONG TERM GOAL #3   Title Patient will demo improved B hip strength to >4/5 MMT in order to improve performance with outdoor ambulation and transfers.   Time 6   Period Weeks   Status New   PT LONG TERM GOAL #4   Title Patient will improve her FOTO to <30% in order to progress towards her PLOF with leisure activities.    Time 6   Period Weeks   PT LONG TERM GOAL #5   Title Patient will report decreased B hip pain to 0-4/10 on a VAS in order to  improve her ability to ambulate in the community with less limitations.    Time 6   Period Weeks   Status New               Plan - 05/31/15 1115    Clinical Impression Statement This session continued focus on LE flexibility and strengthening therex for improved pain report and mobility. She had good tolerance to activity this session with muscle shaking noted and no increase in hip pain. Will continue with current POC.   Rehab Potential Good   PT Frequency 2x / week   PT Duration 6 weeks   PT Treatment/Interventions ADLs/Self Care Home Management;Cryotherapy;Radiation protection practitioner;Therapeutic activities;Therapeutic exercise;Balance training;Neuromuscular re-education;Patient/family education;Manual techniques;Passive range of motion;Taping   PT Next Visit Plan Continue to address B hip strengthening, HS/piriformis/ITB stretches, B hip PROM, gentle long axis hip distraction, and modalities as needed for pain    PT Home Exercise Plan Initiated PT HEP this visit with addition of B hip SLR into flexion, extension, and abduction. Added hip adductor stretch, and bridge this session   Consulted and Agree with Plan of Care Patient      Patient will benefit from skilled therapeutic intervention in order to improve the following deficits and impairments:  Abnormal gait, Decreased strength, Difficulty walking, Impaired flexibility, Hypomobility, Decreased range of motion, Pain  Visit Diagnosis: Pain in left hip  Muscle weakness (generalized)  Stiffness of right hip, not elsewhere classified  Other abnormalities of gait and mobility  Stiffness of left hip, not elsewhere classified  Pain in right hip     Problem List Patient Active Problem List   Diagnosis Date Noted  . DIABETES MELLITUS 04/29/2007  . HYPERCHOLESTEROLEMIA 04/29/2007  . ASTHMA 04/29/2007  . ALLERGY 04/29/2007   11:59 AM,05/31/2015 Marylyn Ishihara PT, DPT [facility] [phone]  [facility] [address] [place], [place], [address] Phone: [phone]   Fax:  [phone]  Name: [name] [name] MRN: [id number] Date of Birth: [birth year]

## 2015-06-05 ENCOUNTER — Ambulatory Visit (HOSPITAL_COMMUNITY): Payer: No Typology Code available for payment source | Admitting: Physical Therapy

## 2015-06-05 DIAGNOSIS — M25652 Stiffness of left hip, not elsewhere classified: Secondary | ICD-10-CM

## 2015-06-05 DIAGNOSIS — M6281 Muscle weakness (generalized): Secondary | ICD-10-CM

## 2015-06-05 DIAGNOSIS — M25552 Pain in left hip: Secondary | ICD-10-CM

## 2015-06-05 DIAGNOSIS — M1612 Unilateral primary osteoarthritis, left hip: Secondary | ICD-10-CM | POA: Diagnosis not present

## 2015-06-05 DIAGNOSIS — M25651 Stiffness of right hip, not elsewhere classified: Secondary | ICD-10-CM

## 2015-06-05 DIAGNOSIS — M25551 Pain in right hip: Secondary | ICD-10-CM

## 2015-06-05 DIAGNOSIS — R2689 Other abnormalities of gait and mobility: Secondary | ICD-10-CM

## 2015-06-05 NOTE — Therapy (Signed)
[facility] [address] [place], [place], [address] Phone: [phone]   Fax:  [phone]  Physical Therapy Treatment  Patient Details  Name: [name] [name] MRN: [id number] Date of Birth: [birth year] Referring Provider: Dr. Orvilla Fus   Encounter Date: 06/05/2015      PT End of Session - 06/05/15 1154    Visit Number 4   Number of Visits 13   Date for PT Re-Evaluation 06/15/15   Authorization Type VA Choice   Authorization Time Period 05/16/2015 to 06/27/2015 ( complete re-assessment at 8th visit)   Authorization - Visit Number 4   Authorization - Number of Visits 8   PT Start Time 0950   PT Stop Time 1030   PT Time Calculation (min) 40 min   Activity Tolerance Patient tolerated treatment well   Behavior During Therapy [facility] for tasks assessed/performed      Past Medical History  Diagnosis Date  . Hyperlipidemia   . Diabetes mellitus without complication   . Allergy   . GERD (gastroesophageal reflux disease)     Past Surgical History  Procedure Laterality Date  . Eye surgery      There were no vitals filed for this visit.      Subjective Assessment - 06/05/15 0950    Subjective Pt states last night and today have been a little rough. She thinks it is weather related, but she was unable to find comfort last night. She tried doing her exercises last night, but it didn't help   Pertinent History DM and BPPV/vertigo    Limitations Walking;Standing   Patient Stated Goals Pt's goal is to reduce B hip pain and improve her hip mobility.    Currently in Pain? Yes   Pain Score 3    Pain Location Hip   Pain Orientation Right;Left   Pain Descriptors / Indicators Aching;Dull;Sharp   Pain Type Chronic pain   Pain Radiating Towards None   Pain Onset More than a month ago   Pain Frequency Constant   Aggravating Factors  Weather, too much walking    Pain Relieving Factors Unsure   Effect of Pain on Daily Activities Not been  able to go back to work                         [facility] Adult PT Treatment/Exercise - 06/05/15 0001    Knee/Hip Exercises: Stretches   Active Hamstring Stretch Both;3 reps;30 seconds   Active Hamstring Stretch Limitations supine with strap   Other Knee/Hip Stretches hip adductor stretch   Knee/Hip Exercises: Standing   Wall Squat 10 reps   Knee/Hip Exercises: Seated   Hamstring Curl 2 sets;10 reps   Knee/Hip Exercises: Supine   Bridges Limitations 3x5 reps alt knee extension   Knee/Hip Exercises: Prone   Straight Leg Raises Both;2 sets;10 reps   Straight Leg Raises Limitations 1.5#                  PT Short Term Goals - 05/16/15 1203    PT SHORT TERM GOAL #1   Title Patient will independently verbalize and demo proper completion of her initial HEP to continue with LE strengthening at home.    Time 2   Period Weeks   Status New   PT SHORT TERM GOAL #2   Title Patient will report decreased max B hip pain to 6/10 on a VAS in order to improve her ability to ambulate in  the community with less limitations.    Time 3   Period Weeks   Status New   PT SHORT TERM GOAL #3   Title Patient will demo improved B hip AROM/PROM by 5-10 degrees in all directions in order to improve B hip mobility and ROM with stair climbing and leisure activities.     Time 3   Period Weeks   Status New           PT Long Term Goals - 05/16/15 1206    PT LONG TERM GOAL #1   Title Patient will independently verbalize and demo proper completion of her advanced HEP to continue with LE strengthening at home once DC.    Time 6   Period Weeks   Status New   PT LONG TERM GOAL #2   Title Patient will demo improved B hip AROM/PROM by 10-15 degrees in all directions in order to improve B hip mobility in order to sit with legs crossed and be able to donn/doff socks/shoes with less difficulty.    Time 6   Period Weeks   Status New   PT LONG TERM GOAL #3   Title Patient will demo  improved B hip strength to >4/5 MMT in order to improve performance with outdoor ambulation and transfers.   Time 6   Period Weeks   Status New   PT LONG TERM GOAL #4   Title Patient will improve her FOTO to <30% in order to progress towards her PLOF with leisure activities.    Time 6   Period Weeks   PT LONG TERM GOAL #5   Title Patient will report decreased B hip pain to 0-4/10 on a VAS in order to improve her ability to ambulate in the community with less limitations.    Time 6   Period Weeks   Status New               Plan - 06/05/15 1154    Clinical Impression Statement This session continued to focus on LE flexibility and therex to improve strength and mobility. Pt was able to perform increased reps and weight with exercises without reported pain or difficulty. HEP was updated this session with pt verbalizing and demonstrating understanding. Will continue with current POC.   Rehab Potential Good   PT Frequency 2x / week   PT Duration 6 weeks   PT Treatment/Interventions ADLs/Self Care Home Management;Cryotherapy;Radiation protection practitionerlectrical Stimulation;Moist Heat;Ultrasound;Gait training;Stair training;Therapeutic activities;Therapeutic exercise;Balance training;Neuromuscular re-education;Patient/family education;Manual techniques;Passive range of motion;Taping   PT Next Visit Plan Continue to address B hip strengthening, HS/piriformis/ITB stretches, B hip PROM, gentle long axis hip distraction, and modalities as needed for pain    PT Home Exercise Plan Initiated PT HEP this visit with addition of B hip SLR into flexion, extension, and abduction. Added hip adductor stretch, and bridge this session; 06/05/15: updated with bridge, alt knee extension   Consulted and Agree with Plan of Care Patient      Patient will benefit from skilled therapeutic intervention in order to improve the following deficits and impairments:  Abnormal gait, Decreased strength, Difficulty walking, Impaired flexibility,  Hypomobility, Decreased range of motion, Pain  Visit Diagnosis: Pain in left hip  Muscle weakness (generalized)  Stiffness of right hip, not elsewhere classified  Other abnormalities of gait and mobility  Stiffness of left hip, not elsewhere classified  Pain in right hip     Problem List Patient Active Problem List   Diagnosis Date Noted  . DIABETES MELLITUS  04/29/2007  . HYPERCHOLESTEROLEMIA 04/29/2007  . ASTHMA 04/29/2007  . ALLERGY 04/29/2007   12:02 PM,06/05/2015 Marylyn Ishihara PT, DPT [facility] [phone]  [facility] [address] [place], [place], [address] Phone: [phone]   Fax:  [phone]  Name: KERYL GHOLSON MRN: [id number] Date of Birth: June 07, 1965

## 2015-06-07 ENCOUNTER — Ambulatory Visit (HOSPITAL_COMMUNITY): Payer: No Typology Code available for payment source | Admitting: Physical Therapy

## 2015-06-07 DIAGNOSIS — M25651 Stiffness of right hip, not elsewhere classified: Secondary | ICD-10-CM

## 2015-06-07 DIAGNOSIS — R2689 Other abnormalities of gait and mobility: Secondary | ICD-10-CM

## 2015-06-07 DIAGNOSIS — M25652 Stiffness of left hip, not elsewhere classified: Secondary | ICD-10-CM

## 2015-06-07 DIAGNOSIS — M25551 Pain in right hip: Secondary | ICD-10-CM

## 2015-06-07 DIAGNOSIS — M1612 Unilateral primary osteoarthritis, left hip: Secondary | ICD-10-CM | POA: Diagnosis not present

## 2015-06-07 DIAGNOSIS — M6281 Muscle weakness (generalized): Secondary | ICD-10-CM

## 2015-06-07 DIAGNOSIS — M25552 Pain in left hip: Secondary | ICD-10-CM

## 2015-06-07 NOTE — Therapy (Signed)
[facility] [address] [place], [place], [address] Phone: [phone]   Fax:  [phone]  Physical Therapy Treatment  Patient Details  Name: [name] [name] MRN: [id number] Date of Birth: 21-Jul-1965 Referring Provider: Dr. Orvilla Fus   Encounter Date: 06/07/2015      PT End of Session - 06/07/15 1006    Visit Number 5   Number of Visits 13   Date for PT Re-Evaluation 06/15/15   Authorization Type [facility] Choice   Authorization Time Period 05/16/2015 to 06/27/2015 ( complete re-assessment at 8th visit)   Authorization - Visit Number 5   Authorization - Number of Visits 8   PT Start Time [phone]   PT Stop Time 1030   PT Time Calculation (min) 43 min   Activity Tolerance Patient tolerated treatment well   Behavior During Therapy [facility] for tasks assessed/performed      Past Medical History  Diagnosis Date  . Hyperlipidemia   . Diabetes mellitus without complication   . Allergy   . GERD (gastroesophageal reflux disease)     Past Surgical History  Procedure Laterality Date  . Eye surgery      There were no vitals filed for this visit.      Subjective Assessment - 06/07/15 0949    Subjective Pt states her knees, hips and hands were bothering her alot yesterday and last night. She is not taking pain meds currently and is unable to find relief. She was unable to do her exercises because of all the pain. She did some of her stretches this morning which seems to help.   Pertinent History DM and BPPV/vertigo    Currently in Pain? Yes   Pain Score --  2.5   Pain Location Hip   Pain Orientation Right;Left   Pain Descriptors / Indicators Aching;Dull;Sharp   Pain Type Chronic pain   Pain Radiating Towards none                         [facility] Adult PT Treatment/Exercise - 06/07/15 0001    Knee/Hip Exercises: Stretches   Active Hamstring Stretch Both;3 reps;30 seconds   Active Hamstring Stretch Limitations supine with  strap   Hip Flexor Stretch 3 reps;30 seconds;Both   Knee/Hip Exercises: Standing   Heel Raises Both;1 set;20 reps   Heel Raises Limitations toe raises   Functional Squat 2 sets;10 reps   Functional Squat Limitations bottom touching chair and cues for posterior wt shift   Wall Squat 10 reps;1 set   Other Standing Knee Exercises side stepping with green TB around ankles, x3RT   Knee/Hip Exercises: Supine   Bridges Limitations 2x10 reps alt knee ext  (+) sagging noted after ~7 kicks                PT Education - 06/07/15 0955    Education provided Yes   Education Details discussed stretching for pain management on rough days; updated HEP   Person(s) Educated Patient   Methods Explanation;Demonstration   Comprehension Verbalized understanding;Returned demonstration          PT Short Term Goals - 05/16/15 1203    PT SHORT TERM GOAL #1   Title Patient will independently verbalize and demo proper completion of her initial HEP to continue with LE strengthening at home.    Time 2   Period Weeks   Status New   PT SHORT TERM GOAL #2   Title Patient will report  decreased max B hip pain to 6/10 on a VAS in order to improve her ability to ambulate in the community with less limitations.    Time 3   Period Weeks   Status New   PT SHORT TERM GOAL #3   Title Patient will demo improved B hip AROM/PROM by 5-10 degrees in all directions in order to improve B hip mobility and ROM with stair climbing and leisure activities.     Time 3   Period Weeks   Status New           PT Long Term Goals - 05/16/15 1206    PT LONG TERM GOAL #1   Title Patient will independently verbalize and demo proper completion of her advanced HEP to continue with LE strengthening at home once DC.    Time 6   Period Weeks   Status New   PT LONG TERM GOAL #2   Title Patient will demo improved B hip AROM/PROM by 10-15 degrees in all directions in order to improve B hip mobility in order to sit with legs  crossed and be able to donn/doff socks/shoes with less difficulty.    Time 6   Period Weeks   Status New   PT LONG TERM GOAL #3   Title Patient will demo improved B hip strength to >4/5 MMT in order to improve performance with outdoor ambulation and transfers.   Time 6   Period Weeks   Status New   PT LONG TERM GOAL #4   Title Patient will improve her FOTO to <30% in order to progress towards her PLOF with leisure activities.    Time 6   Period Weeks   PT LONG TERM GOAL #5   Title Patient will report decreased B hip pain to 0-4/10 on a VAS in order to improve her ability to ambulate in the community with less limitations.    Time 6   Period Weeks   Status New               Plan - 06/07/15 1030    Clinical Impression Statement Today's session focused on LE flexibility and closed chain strengthening ther ex. Pt is demonstrating improved LE strength evident by her ability to perform ther ex with increased reps and level of difficulty throughout her session. Will continue with current POC.   Rehab Potential Good   PT Frequency 2x / week   PT Duration 6 weeks   PT Treatment/Interventions ADLs/Self Care Home Management;Cryotherapy;Radiation protection practitioner;Therapeutic activities;Therapeutic exercise;Balance training;Neuromuscular re-education;Patient/family education;Manual techniques;Passive range of motion;Taping   PT Next Visit Plan functional B hip strengthening, HS/piriformis/ITB stretches, B hip PROM, gentle long axis hip distraction, and modalities as needed for pain    PT Home Exercise Plan Initiated PT HEP this visit with addition of B hip SLR into flexion, extension, and abduction. Added hip adductor stretch, and bridge this session; 06/05/15: updated with bridge, alt knee extension 06/07/15: updated with squats   Consulted and Agree with Plan of Care Patient      Patient will benefit from skilled therapeutic intervention in  order to improve the following deficits and impairments:  Abnormal gait, Decreased strength, Difficulty walking, Impaired flexibility, Hypomobility, Decreased range of motion, Pain  Visit Diagnosis: Pain in left hip  Muscle weakness (generalized)  Stiffness of right hip, not elsewhere classified  Other abnormalities of gait and mobility  Stiffness of left hip, not elsewhere classified  Pain in right hip  Problem List Patient Active Problem List   Diagnosis Date Noted  . DIABETES MELLITUS 04/29/2007  . HYPERCHOLESTEROLEMIA 04/29/2007  . ASTHMA 04/29/2007  . ALLERGY 04/29/2007    10:37 AM,06/07/2015 Marylyn IshiharaSara Kiser PT, DPT [facility] [phone]  [facility] [address] [place], [place], [address] Phone: [phone]   Fax:  [phone]  Name: [name] [name] MRN: [id number] Date of Birth: 1965/09/17

## 2015-06-12 ENCOUNTER — Ambulatory Visit (HOSPITAL_COMMUNITY): Payer: No Typology Code available for payment source | Admitting: Physical Therapy

## 2015-06-12 DIAGNOSIS — M25551 Pain in right hip: Secondary | ICD-10-CM

## 2015-06-12 DIAGNOSIS — R2689 Other abnormalities of gait and mobility: Secondary | ICD-10-CM

## 2015-06-12 DIAGNOSIS — M6281 Muscle weakness (generalized): Secondary | ICD-10-CM

## 2015-06-12 DIAGNOSIS — M25651 Stiffness of right hip, not elsewhere classified: Secondary | ICD-10-CM

## 2015-06-12 DIAGNOSIS — M25652 Stiffness of left hip, not elsewhere classified: Secondary | ICD-10-CM

## 2015-06-12 DIAGNOSIS — M25552 Pain in left hip: Secondary | ICD-10-CM

## 2015-06-12 DIAGNOSIS — M1612 Unilateral primary osteoarthritis, left hip: Secondary | ICD-10-CM | POA: Diagnosis not present

## 2015-06-12 NOTE — Therapy (Signed)
[place] [facility] [address] [place], [place], [address] Phone: [phone]   Fax:  [phone]  Physical Therapy Treatment  Patient Details  Name: [name] [name] MRN: [id number] Date of Birth: Feb 28, 1965 Referring Provider: Dr. Orvilla Fus   Encounter Date: 06/12/2015      PT End of Session - 06/12/15 1018    Visit Number 6   Number of Visits 13   Date for PT Re-Evaluation 06/15/15   Authorization Type [facility] Choice   Authorization Time Period 05/16/2015 to 06/27/2015 ( complete re-assessment at 8th visit)   Authorization - Visit Number 6   Authorization - Number of Visits 8   PT Start Time 0957  pt arrived late   PT Stop Time 1029   PT Time Calculation (min) 32 min   Activity Tolerance Patient tolerated treatment well   Behavior During Therapy [facility] for tasks assessed/performed      Past Medical History  Diagnosis Date  . Hyperlipidemia   . Diabetes mellitus without complication   . Allergy   . GERD (gastroesophageal reflux disease)     Past Surgical History  Procedure Laterality Date  . Eye surgery      There were no vitals filed for this visit.      Subjective Assessment - 06/12/15 1000    Subjective Pt states she continues to have pain in B hips, hands, knees and shoulders due to the bad weather lately. She has done some of her HEP when she can and was tryng to workout some of her soreness with her HEP.    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    Currently in Pain? Yes   Pain Score 3    Pain Location Hip  knees and wrist   Pain Orientation Right;Left   Pain Descriptors / Indicators Aching   Pain Type Chronic pain   Pain Radiating Towards none   Pain Onset More than a month ago   Pain Frequency Constant   Aggravating Factors  weather                          [facility] Adult PT Treatment/Exercise - 06/12/15 0001    Knee/Hip Exercises: Stretches   Active Hamstring Stretch Both;3 reps;30 seconds   Active Hamstring Stretch Limitations standing   Knee/Hip Exercises: Machines for Strengthening   Total Gym Leg Press L 27 SL squat 2x10 each   Knee/Hip Exercises: Standing   Forward Step Up Both;2 sets;10 reps;Step Height: 6"  intermittent UE support   Other Standing Knee Exercises attempted half knee to stand with increased difficulty and knee pain                PT Education - 06/12/15 1011    Education provided Yes   Education Details reviewed HEP and discussed progress made so far and anticipated reassessment next visit   Person(s) Educated Patient   Methods Explanation   Comprehension Verbalized understanding          PT Short Term Goals - 05/16/15 1203    PT SHORT TERM GOAL #1   Title Patient will independently verbalize and demo proper completion of her initial HEP to continue with LE strengthening at home.    Time 2   Period Weeks   Status New  PT SHORT TERM GOAL #2   Title Patient will report decreased max B hip pain to 6/10 on a VAS in order to improve her ability to ambulate in the community with less limitations.    Time 3   Period Weeks   Status New   PT SHORT TERM GOAL #3   Title Patient will demo improved B hip AROM/PROM by 5-10 degrees in all directions in order to improve B hip mobility and ROM with stair climbing and leisure activities.     Time 3   Period Weeks   Status New           PT Long Term Goals - 05/16/15 1206    PT LONG TERM GOAL #1   Title Patient will independently verbalize and demo proper completion of her advanced HEP to continue with LE strengthening at home once DC.    Time 6   Period Weeks   Status New   PT LONG TERM GOAL #2   Title Patient will demo improved B hip AROM/PROM by 10-15 degrees in all directions in order to improve B hip mobility in order to sit with legs crossed and be  able to donn/doff socks/shoes with less difficulty.    Time 6   Period Weeks   Status New   PT LONG TERM GOAL #3   Title Patient will demo improved B hip strength to >4/5 MMT in order to improve performance with outdoor ambulation and transfers.   Time 6   Period Weeks   Status New   PT LONG TERM GOAL #4   Title Patient will improve her FOTO to <30% in order to progress towards her PLOF with leisure activities.    Time 6   Period Weeks   PT LONG TERM GOAL #5   Title Patient will report decreased B hip pain to 0-4/10 on a VAS in order to improve her ability to ambulate in the community with less limitations.    Time 6   Period Weeks   Status New               Plan - 06/12/15 1019    Clinical Impression Statement Today's session focused on closed chain strengthening for BLE. Pt tolerated exercise progressions well without much increase in pain. She demonstrates increased difficulty with Lt single leg squat on the total gym as well as muscle shaking throughout the activity. Due to pt arriving late, reassessment was deferred to her next visit. Will continue with current POC.   Rehab Potential Good   PT Frequency 2x / week   PT Duration 6 weeks   PT Treatment/Interventions ADLs/Self Care Home Management;Cryotherapy;Radiation protection practitioner;Therapeutic activities;Therapeutic exercise;Balance training;Neuromuscular re-education;Patient/family education;Manual techniques;Passive range of motion;Taping   PT Next Visit Plan reassess next visit; hamstring therex   PT Home Exercise Plan Initiated PT HEP this visit with addition of B hip SLR into flexion, extension, and abduction. Added hip adductor stretch, and bridge this session; 06/05/15: updated with bridge, alt knee extension 06/07/15: updated with squats   Consulted and Agree with Plan of Care Patient      Patient will benefit from skilled therapeutic intervention in order to improve  the following deficits and impairments:  Abnormal gait, Decreased strength, Difficulty walking, Impaired flexibility, Hypomobility, Decreased range of motion, Pain  Visit Diagnosis: Pain in left hip  Muscle weakness (generalized)  Stiffness of right hip, not elsewhere classified  Other abnormalities of gait and mobility  Stiffness of left hip, not  elsewhere classified  Pain in right hip     Problem List Patient Active Problem List   Diagnosis Date Noted  . DIABETES MELLITUS 04/29/2007  . HYPERCHOLESTEROLEMIA 04/29/2007  . ASTHMA 04/29/2007  . ALLERGY 04/29/2007    10:55 AM,06/12/2015 Marylyn IshiharaSara Kiser PT, DPT [facility] [phone]  [facility] [address] [place], [place], [address] Phone: [phone]   Fax:  [phone]  Name: [name] [name] MRN: [id number] Date of Birth: [birth year]

## 2015-06-14 ENCOUNTER — Ambulatory Visit (HOSPITAL_COMMUNITY): Payer: No Typology Code available for payment source | Admitting: Physical Therapy

## 2015-06-14 DIAGNOSIS — M25552 Pain in left hip: Secondary | ICD-10-CM

## 2015-06-14 DIAGNOSIS — M6281 Muscle weakness (generalized): Secondary | ICD-10-CM

## 2015-06-14 DIAGNOSIS — R2689 Other abnormalities of gait and mobility: Secondary | ICD-10-CM

## 2015-06-14 DIAGNOSIS — M25551 Pain in right hip: Secondary | ICD-10-CM

## 2015-06-14 DIAGNOSIS — M1612 Unilateral primary osteoarthritis, left hip: Secondary | ICD-10-CM | POA: Diagnosis not present

## 2015-06-14 DIAGNOSIS — M25652 Stiffness of left hip, not elsewhere classified: Secondary | ICD-10-CM

## 2015-06-14 DIAGNOSIS — M25651 Stiffness of right hip, not elsewhere classified: Secondary | ICD-10-CM

## 2015-06-14 NOTE — Therapy (Signed)
[place], [place], [address] Phone: [phone]   Fax:  [phone]  Physical Therapy Treatment/Reassessment  Patient Details  Name: [name] [name] MRN: [id number] Date of Birth: 1965/10/25 Referring Provider: Allena Earing  Encounter Date: 06/14/2015      PT End of Session - 06/14/15 1017    Visit Number 7   Number of Visits 13   Date for PT Re-Evaluation 06/27/15   Authorization Type [facility] Choice   Authorization Time Period 05/16/2015 to 06/27/2015 Reassessed at 7th visit   Authorization - Visit Number 8   Authorization - Number of Visits 8   PT Start Time [phone]   PT Stop Time 1030   PT Time Calculation (min) 44 min   Activity Tolerance Patient tolerated treatment well   Behavior During Therapy [facility] for tasks assessed/performed      Past Medical History  Diagnosis Date  . Hyperlipidemia   . Diabetes mellitus without complication   . Allergy   . GERD (gastroesophageal reflux disease)     Past Surgical History  Procedure Laterality Date  . Eye surgery      There were no vitals filed for this visit.      Subjective Assessment - 06/14/15 0953    Subjective Pt states she is doing great now that the weather has improved. No report of pain and states she did extra sets and reps of her exercises without difficulty. She feels she has made alot of improvement since beginning therapy and is going to start walking more.    Pertinent History DM and BPPV/vertigo    Limitations --  None currently, but is going to try walking again   How long can you sit comfortably? unlimited, no more squirming   How long can you stand comfortably? 1 hour    How long can you walk comfortably? 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    Currently in Pain? No/denies            [facility] PT Assessment - 06/14/15 0001    Assessment   Medical Diagnosis B hip pain / hip OA   Referring Provider Soheir Boshra   Onset Date/Surgical Date 04/17/13   Hand Dominance Right   [facility] 2017   Prior Therapy None for her current complaints   Precautions   Precautions None   Restrictions   Weight Bearing Restrictions No   Balance Screen   Has the patient fallen in the past 6 months No   Has the patient had a decrease in activity level because of a fear of falling?  No   Is the patient reluctant to leave their home because of a fear of falling?  No   Prior Function   Level of Independence Independent   Cognition   Overall Cognitive Status Within Functional Limits for tasks assessed   Observation/Other Assessments   Focus on Therapeutic Outcomes (FOTO)  --   Sensation   Light Touch Appears Intact   AROM   Right Hip Extension 10   Right Hip Flexion 120   Right Hip External Rotation  30   Right Hip Internal Rotation  30   Right Hip ABduction 30   Left Hip Extension 10   Left Hip Flexion 125   Left Hip External Rotation  40  Left Hip Internal Rotation  30   Left Hip ABduction 30   Strength   Right Hip Flexion 4+/5   Right Hip Extension 4/5   Right Hip ABduction 5/5   Right Hip ADduction 4+/5   Left Hip Flexion 5/5   Left Hip Extension 4/5   Left Hip ABduction 4+/5   Left Hip ADduction 4+/5   Right Knee Flexion 5/5   Right Knee Extension 5/5   Left Knee Flexion 5/5   Left Knee Extension 5/5   Right Ankle Dorsiflexion 5/5   Left Ankle Dorsiflexion 5/5   Flexibility   Soft Tissue Assessment /Muscle Length yes   Hamstrings HS 90/90 (L/R)= -10 degrees /-12 degrees    Quadriceps Thomas test= Positive for moderate tightness of B quads/hip flexors    ITB Obers=Postive for tightness, bilaterally   Palpation   Palpation comment TTP along Rt adductors and L illiopsoas   Special Tests    Special Tests --   Hip Special Tests  --   Hip Scouring   Findings --   Side --   Ambulation/Gait    Ambulation/Gait --   Ambulation/Gait Assistance --   Ambulation Distance (Feet) --   Assistive device --   Gait Pattern --                             PT Education - 06/14/15 1245    Education provided Yes   Education Details reviewed goals/POC; encouraged pt to ease back in to walking; possible anticipated d/c in the next several visits if no new issues arise   Person(s) Educated Patient   Methods Explanation;Demonstration   Comprehension Verbalized understanding;Returned demonstration          PT Short Term Goals - 06/14/15 1013    PT SHORT TERM GOAL #1   Title Patient will independently verbalize and demo proper completion of her initial HEP to continue with LE strengthening at home.    Time 2   Period Weeks   Status Achieved   PT SHORT TERM GOAL #2   Title Patient will report decreased max B hip pain to 6/10 on a VAS in order to improve her ability to ambulate in the community with less limitations.    Baseline 7/10 durind bad weather, 0/10 when weather is not bad   Time 3   Period Weeks   Status Partially Met   PT SHORT TERM GOAL #3   Title Patient will demo improved B hip AROM/PROM by 5-10 degrees in all directions in order to improve B hip mobility and ROM with stair climbing and leisure activities.     Time 3   Period Weeks   Status Achieved           PT Long Term Goals - 06/14/15 1014    PT LONG TERM GOAL #1   Title Patient will independently verbalize and demo proper completion of her advanced HEP to continue with LE strengthening at home once DC.    Time 6   Period Weeks   Status Achieved   PT LONG TERM GOAL #2   Title Patient will demo improved B hip AROM/PROM by 10-15 degrees in all directions in order to improve B hip mobility in order to sit with legs crossed and be able to donn/doff socks/shoes with less difficulty.    Time 6   Period Weeks   Status Partially Met   PT LONG TERM GOAL #3  Title Patient will demo improved B  hip strength to >4/5 MMT in order to improve performance with outdoor ambulation and transfers.   Time 6   Period Weeks   Status Achieved   PT LONG TERM GOAL #4   Title Patient will improve her FOTO to <30% in order to progress towards her PLOF with leisure activities.    Time 6   Period Weeks   PT LONG TERM GOAL #5   Title Patient will report decreased B hip pain to 0-4/10 on a VAS in order to improve her ability to ambulate in the community with less limitations.    Baseline 7/10 during bad weather   Time 6   Period Weeks   Status Partially Met   Additional Long Term Goals   Additional Long Term Goals Yes   PT LONG TERM GOAL #6   Title Pt will demonstrate improved mobility and pain relief evident by her ability to return to walking exercise for atleast 26mn without requiring a break due to pain.    Time 2   Period Weeks   Status New               Plan - 06/14/15 1048    Clinical Impression Statement Pt was reassessed this session having met most of her goals since she began OPPT. She is now experiencing minimal pain, except for days of bad weather where it increases to a 7/10 throughout multiple joints of her body. She is overall demonstrating improved B hip ROM, strength and mobility stating she is able to put her shoes on with ease compared to how she was doing before. She is regularly performing her updated exercises at home and is going to begin walking for exercise again. At this time, remaining limitations include trigger points in her Rt hip adductors and Lt iliopsoas which responds well to manual treatment. Also noting moderate limitations in hip flexor/quad tightness of BLE. Therapist discussed progress with pt as well as remaining limitations. She is in agreement with proposed possible d/c in the next several visits as long as no new issues arise.   Rehab Potential Good   PT Frequency 2x / week   PT Duration 6 weeks   PT Treatment/Interventions ADLs/Self Care Home  Management;Cryotherapy;EEnglish as a second language teacherTherapeutic activities;Therapeutic exercise;Balance training;Neuromuscular re-education;Patient/family education;Manual techniques;Passive range of motion;Taping   PT Next Visit Plan hip flexor/quad stretch; TrP release hip add/illiopsoas   PT Home Exercise Plan no updates this visit   Consulted and Agree with Plan of Care Patient      Patient will benefit from skilled therapeutic intervention in order to improve the following deficits and impairments:  Abnormal gait, Decreased strength, Difficulty walking, Impaired flexibility, Hypomobility, Decreased range of motion, Pain  Visit Diagnosis: Pain in left hip  Muscle weakness (generalized)  Stiffness of right hip, not elsewhere classified  Other abnormalities of gait and mobility  Stiffness of left hip, not elsewhere classified  Pain in right hip     Problem List Patient Active Problem List   Diagnosis Date Noted  . DIABETES MELLITUS 04/29/2007  . HYPERCHOLESTEROLEMIA 04/29/2007  . ASTHMA 04/29/2007  . ALLERGY 04/29/2007    12:56 PM,06/14/2015 SElly ModenaPT, DPT [facility] [address] [address]Phone: [phone]  Fax:  [phone] Name: DICHELLE HARRALMRN: [id number] of Birth: [birth year]

## 2015-06-19 ENCOUNTER — Encounter (HOSPITAL_COMMUNITY): Payer: Self-pay

## 2015-06-19 ENCOUNTER — Emergency Department (HOSPITAL_COMMUNITY): Payer: Non-veteran care

## 2015-06-19 ENCOUNTER — Emergency Department (HOSPITAL_COMMUNITY)
Admission: EM | Admit: 2015-06-19 | Discharge: 2015-06-19 | Disposition: A | Discharge status: Home / Self Care | Payer: Non-veteran care | Attending: Emergency Medicine | Admitting: Emergency Medicine

## 2015-06-19 ENCOUNTER — Encounter (HOSPITAL_COMMUNITY): Payer: No Typology Code available for payment source | Admitting: Physical Therapy

## 2015-06-19 DIAGNOSIS — R1011 Right upper quadrant pain: Secondary | ICD-10-CM | POA: Insufficient documentation

## 2015-06-19 DIAGNOSIS — R112 Nausea with vomiting, unspecified: Secondary | ICD-10-CM | POA: Diagnosis not present

## 2015-06-19 DIAGNOSIS — E119 Type 2 diabetes mellitus without complications: Secondary | ICD-10-CM | POA: Diagnosis not present

## 2015-06-19 DIAGNOSIS — R0602 Shortness of breath: Secondary | ICD-10-CM | POA: Diagnosis not present

## 2015-06-19 DIAGNOSIS — R110 Nausea: Secondary | ICD-10-CM

## 2015-06-19 DIAGNOSIS — R079 Chest pain, unspecified: Secondary | ICD-10-CM | POA: Insufficient documentation

## 2015-06-19 DIAGNOSIS — R05 Cough: Secondary | ICD-10-CM | POA: Diagnosis not present

## 2015-06-19 DIAGNOSIS — R109 Unspecified abdominal pain: Secondary | ICD-10-CM

## 2015-06-19 DIAGNOSIS — Z7984 Long term (current) use of oral hypoglycemic drugs: Secondary | ICD-10-CM | POA: Diagnosis not present

## 2015-06-19 DIAGNOSIS — Z79899 Other long term (current) drug therapy: Secondary | ICD-10-CM | POA: Insufficient documentation

## 2015-06-19 DIAGNOSIS — R51 Headache: Secondary | ICD-10-CM | POA: Diagnosis not present

## 2015-06-19 DIAGNOSIS — R509 Fever, unspecified: Secondary | ICD-10-CM | POA: Insufficient documentation

## 2015-06-19 DIAGNOSIS — E785 Hyperlipidemia, unspecified: Secondary | ICD-10-CM | POA: Insufficient documentation

## 2015-06-19 LAB — CBC WITH DIFFERENTIAL/PLATELET
Basophils Absolute: 0 10*3/uL (ref 0.0–0.1)
Basophils Relative: 0 %
Eosinophils Absolute: 0.1 10*3/uL (ref 0.0–0.7)
Eosinophils Relative: 1 %
HCT: 47.9 % — ABNORMAL HIGH (ref 36.0–46.0)
Hemoglobin: 17.4 g/dL — ABNORMAL HIGH (ref 12.0–15.0)
Lymphocytes Relative: 24 %
Lymphs Abs: 2.8 10*3/uL (ref 0.7–4.0)
MCH: 33.4 pg (ref 26.0–34.0)
MCHC: 36.3 g/dL — ABNORMAL HIGH (ref 30.0–36.0)
MCV: 91.9 fL (ref 78.0–100.0)
Monocytes Absolute: 0.9 10*3/uL (ref 0.1–1.0)
Monocytes Relative: 8 %
Neutro Abs: 8 10*3/uL — ABNORMAL HIGH (ref 1.7–7.7)
Neutrophils Relative %: 67 %
Platelets: 290 10*3/uL (ref 150–400)
RBC: 5.21 MIL/uL — ABNORMAL HIGH (ref 3.87–5.11)
RDW: 12.3 % (ref 11.5–15.5)
WBC: 11.9 10*3/uL — ABNORMAL HIGH (ref 4.0–10.5)

## 2015-06-19 LAB — URINE MICROSCOPIC-ADD ON: RBC / HPF: NONE SEEN RBC/hpf (ref 0–5)

## 2015-06-19 LAB — URINALYSIS, ROUTINE W REFLEX MICROSCOPIC
Glucose, UA: 500 mg/dL — AB
Hgb urine dipstick: NEGATIVE
Ketones, ur: >80 mg/dL — AB
Leukocytes, UA: NEGATIVE
Nitrite: NEGATIVE
Protein, ur: 30 mg/dL — AB
Specific Gravity, Urine: 1.02 (ref 1.005–1.030)
pH: 5.5 (ref 5.0–8.0)

## 2015-06-19 LAB — COMPREHENSIVE METABOLIC PANEL
ALT: 14 U/L (ref 14–54)
AST: 11 U/L — ABNORMAL LOW (ref 15–41)
Albumin: 4.7 g/dL (ref 3.5–5.0)
Alkaline Phosphatase: 52 U/L (ref 38–126)
Anion gap: 14 (ref 5–15)
BUN: 12 mg/dL (ref 6–20)
CO2: 13 mmol/L — ABNORMAL LOW (ref 22–32)
Calcium: 9.7 mg/dL (ref 8.9–10.3)
Chloride: 103 mmol/L (ref 101–111)
Creatinine, Ser: 0.86 mg/dL (ref 0.44–1.00)
GFR calc Af Amer: >60 mL/min (ref >60)
GFR calc non Af Amer: >60 mL/min (ref >60)
Glucose, Bld: 198 mg/dL — ABNORMAL HIGH (ref 65–99)
Potassium: 3.8 mmol/L (ref 3.5–5.1)
Sodium: 130 mmol/L — ABNORMAL LOW (ref 135–145)
Total Bilirubin: 1.2 mg/dL (ref 0.3–1.2)
Total Protein: 8.8 g/dL — ABNORMAL HIGH (ref 6.5–8.1)

## 2015-06-19 LAB — LIPASE, BLOOD: Lipase: 34 U/L (ref 11–51)

## 2015-06-19 LAB — LACTIC ACID, PLASMA: Lactic Acid, Venous: 1.1 mmol/L (ref 0.5–2.0)

## 2015-06-19 LAB — I-STAT TROPONIN, ED: Troponin i, poc: 0 ng/mL (ref 0.00–0.08)

## 2015-06-19 LAB — PREGNANCY, URINE: Preg Test, Ur: NEGATIVE

## 2015-06-19 MED ORDER — SODIUM CHLORIDE 0.9 % IV BOLUS (SEPSIS)
1000.0000 mL | Freq: Once | INTRAVENOUS | Status: AC
  Administered 2015-06-19: 1000 mL via INTRAVENOUS

## 2015-06-19 MED ORDER — ONDANSETRON 4 MG PO TBDP: 4.0000 mg | ORAL_TABLET | Freq: Three times a day (TID) | ORAL | Status: DC | PRN

## 2015-06-19 MED ORDER — IOPAMIDOL (ISOVUE-300) INJECTION 61%
100.0000 mL | Freq: Once | INTRAVENOUS | Status: AC | PRN
  Administered 2015-06-19: 100 mL via INTRAVENOUS

## 2015-06-19 MED ORDER — ONDANSETRON 4 MG PO TBDP
4.0000 mg | ORAL_TABLET | Freq: Once | ORAL | Status: DC
  Filled 2015-06-19: qty 1

## 2015-06-19 MED ORDER — ONDANSETRON HCL 4 MG/2ML IJ SOLN: 4.0000 mg | Freq: Once | INTRAMUSCULAR | Status: DC

## 2015-06-19 NOTE — ED Notes (Signed)
EDP at bedside  

## 2015-06-19 NOTE — ED Notes (Signed)
EDP at bedside updating patient and family. 

## 2015-06-19 NOTE — ED Notes (Signed)
Given prepackage of Zofran quantity 4 and instructions on use, patient verbally understands.

## 2015-06-19 NOTE — ED Provider Notes (Signed)
CSN: [id number]     Arrival date & time 06/19/15  1244 History   First MD Initiated Contact with Patient 06/19/15 1353     Chief Complaint  Patient presents with  . Abdominal Pain     (Consider location/radiation/quality/duration/timing/severity/associated sxs/prior Treatment) HPI Comments: [name] [name] is a 50 y.o. Female reports to ED with complaint of abdominal pain, nausea, and vomiting. Patient was seen by PCP Dr. Pasty Arch at [facility] - sent over for imaging and evaluation of possible gallbladder etiology. She initially felt flushed on Saturday, while at dinner at a steakhouse. Abdominal pain is in the epigastric and right upper quadrant with radiation to her right shoulder and is intermittent in nature. Pain is worse when she lays on her right side. She vomited on Sunday and has associated nausea. She is able to keep fluids down, but has a decrease in appetite. Last bowel movement last Thursday. No blood in stool. No urinary symptoms. No vaginal pain, discharge, or bleeding.   Additional symptoms include right sided chest pain, shortness of breath, and palpitations. Denies lower extremity swelling, long distance travel, immobilization, exogenous estrogen use, history of cancer, or history of blood clots. She does have a history of high cholesterol and diabetes. [facility].      Patient is a 50 y.o. female presenting with abdominal pain. The history is provided by the patient.  Abdominal Pain Pain location:  RUQ Pain radiates to:  R shoulder Associated symptoms: chest pain, cough ( no hemoptysis), fever, nausea, shortness of breath and vomiting   Associated symptoms: no chills, no dysuria, no fatigue, no hematuria, no sore throat, no vaginal bleeding and no vaginal discharge     Past Medical History  Diagnosis Date  . Hyperlipidemia   . Diabetes mellitus without complication (HCC)   . Allergy   . GERD (gastroesophageal  reflux disease)    History reviewed. No pertinent past surgical history. Family History  Problem Relation Age of Onset  . Cancer Mother   . Diabetes Mother   . Cancer Father   . Diabetes Father   . Heart disease Father    Social History  Substance Use Topics  . Smoking status: Never Smoker   . Smokeless tobacco: None     Comment: quit 1997  . Alcohol Use: No   OB History    No data available     Review of Systems  Constitutional: Positive for fever and appetite change. Negative for chills, diaphoresis and fatigue.  HENT: Negative for sore throat and trouble swallowing.   Eyes: Negative for visual disturbance.  Respiratory: Positive for cough ( no hemoptysis) and shortness of breath.   Cardiovascular: Positive for chest pain and palpitations. Negative for leg swelling.  Gastrointestinal: Positive for nausea, vomiting and abdominal pain. Negative for blood in stool.  Genitourinary: Negative for dysuria, frequency, hematuria, flank pain, vaginal bleeding and vaginal discharge.  Musculoskeletal: Negative for arthralgias and neck pain.  Skin: Negative for rash.  Neurological: Positive for headaches ( mild). Negative for dizziness, light-headedness and numbness.      Allergies  Sulfa antibiotics  Home Medications   Prior to Admission medications   Medication Sig Start Date End Date Taking? Authorizing Provider  Cholecalciferol (VITAMIN D PO) Take 1 tablet by mouth daily.   Yes Historical Provider, MD  Cyanocobalamin (VITAMIN B-12 PO) Take 1 tablet by mouth every other day.    Yes Historical  Provider, MD  dapagliflozin propanediol (FARXIGA) 5 MG TABS tablet Take 5 mg by mouth daily.   Yes Historical Provider, MD  metFORMIN (GLUCOPHAGE) 1000 MG tablet Take 500 mg by mouth 2 (two) times daily with a meal.    Yes Historical Provider, MD  omeprazole (PRILOSEC) 40 MG capsule Take 40 mg by mouth daily. Reported on 05/16/2015   Yes Historical Provider, MD  simvastatin (ZOCOR) 20 MG  tablet Take 20 mg by mouth daily.    Yes Historical Provider, MD  ondansetron (ZOFRAN ODT) 4 MG disintegrating tablet Take 1 tablet (4 mg total) by mouth every 8 (eight) hours as needed for nausea or vomiting. 06/19/15   Lona Kettle, PA-C   BP 119/72 mmHg  Pulse 83  Temp(Src) 98.2 F (36.8 C)  Resp 20  Ht 5\' 5"  (1.651 m)  Wt 70.761 kg  BMI 25.96 kg/m2  SpO2 97%  LMP 05/24/2015 Physical Exam  Constitutional: She appears well-developed and well-nourished. No distress.  HENT:  Head: Normocephalic and atraumatic.  Mouth/Throat: Oropharynx is clear and moist. No oropharyngeal exudate.  Eyes: Conjunctivae and EOM are normal. Pupils are equal, round, and reactive to light. Right eye exhibits no discharge. Left eye exhibits no discharge. No scleral icterus.  Neck: Normal range of motion. Neck supple.  Cardiovascular: Normal rate, regular rhythm, normal heart sounds and intact distal pulses.   No murmur heard. Pulmonary/Chest: Effort normal and breath sounds normal. No respiratory distress.  Abdominal: Soft. Bowel sounds are normal. There is tenderness ( LLQ, RUQ, epigastric). There is no rebound and no guarding.  Musculoskeletal: Normal range of motion.  Lymphadenopathy:    She has no cervical adenopathy.  Neurological: She is alert. Coordination normal.  Skin: Skin is warm and dry. She is not diaphoretic.  Psychiatric: She has a normal mood and affect.    ED Course  Procedures (including critical care time) Labs Review Labs Reviewed  CBC WITH DIFFERENTIAL/PLATELET - Abnormal; Notable for the following:    WBC 11.9 (*)    RBC 5.21 (*)    Hemoglobin 17.4 (*)    HCT 47.9 (*)    MCHC 36.3 (*)    Neutro Abs 8.0 (*)    All other components within normal limits  COMPREHENSIVE METABOLIC PANEL - Abnormal; Notable for the following:    Sodium 130 (*)    CO2 13 (*)    Glucose, Bld 198 (*)    Total Protein 8.8 (*)    AST 11 (*)    All other components within normal limits   URINALYSIS, ROUTINE W REFLEX MICROSCOPIC (NOT AT [facility]) - Abnormal; Notable for the following:    Glucose, UA 500 (*)    Bilirubin Urine SMALL (*)    Ketones, ur >80 (*)    Protein, ur 30 (*)    All other components within normal limits  URINE MICROSCOPIC-ADD ON - Abnormal; Notable for the following:    Squamous Epithelial / LPF 0-5 (*)    Bacteria, UA RARE (*)    Casts HYALINE CASTS (*)    All other components within normal limits  LIPASE, BLOOD  LACTIC ACID, PLASMA  PREGNANCY, URINE  I-STAT TROPOININ, ED    Imaging Review Dg Chest 2 View  06/19/2015  CLINICAL DATA:  c/o abd pain in LUQ and RUQ radiation around to her back. Epigastric pain --- had pain since Saturday. vomited Sunday EXAM: CHEST  2 VIEW COMPARISON:  11/05/2009 FINDINGS: The heart size and mediastinal contours are within normal limits.  Both lungs are clear. The visualized skeletal structures are unremarkable. IMPRESSION: No active cardiopulmonary disease. Electronically Signed   By: Norva PavlovElizabeth  Brown M.D.   On: 06/19/2015 15:28   [place] Abdomen Complete  06/19/2015  CLINICAL DATA:  Right upper quadrant pain EXAM: ABDOMEN ULTRASOUND COMPLETE COMPARISON:  03/05/2007 FINDINGS: Gallbladder: Well dilated with mild gallbladder sludge. No definitive stones are noted. Common bile duct: Diameter: 4 mm. Liver: Increased in echogenicity consistent with fatty infiltration. This is similar to that seen on prior CT examination. Some increased echogenicity of the portal triads is noted. This can be seen in varied etiologies to include hepatitis another viral etiologies. IVC: Not well visualized Pancreas: All visual Spleen: Size and appearance within normal limits. Right Kidney: Length: 12.7 cm. Echogenicity within normal limits. No mass or hydronephrosis visualized. Left Kidney: Length: 12.2 cm. Echogenicity within normal limits. No mass or hydronephrosis visualized. Abdominal aorta: Partially visualized without focal abnormality Other findings:  None. IMPRESSION: Gallbladder sludge without complicating factors. Fatty liver. Slight increased echogenicity of the portal triads of uncertain significance. This can be seen in viral etiology is and underlying hepatitis. Clinical correlation is recommended. Electronically Signed   By: Alcide CleverMark  Lukens M.D.   On: 06/19/2015 15:22   Ct Abdomen Pelvis W Contrast  06/19/2015  CLINICAL DATA:  Left and right upper quadrant abdominal pain radiating to the back. Evaluate gallbladder. EXAM: CT ABDOMEN AND PELVIS WITH CONTRAST TECHNIQUE: Multidetector CT imaging of the abdomen and pelvis was performed using the standard protocol following bolus administration of intravenous contrast. CONTRAST:  100mL ISOVUE-300 IOPAMIDOL (ISOVUE-300) INJECTION 61% COMPARISON:  Abdominal ultrasound -earlier same day; CT abdomen pelvis - 03/05/2007 FINDINGS: Lower chest: Limited visualization of the lower thorax is negative for focal airspace opacity or pleural effusion. Normal heart size.  No pericardial effusion. Hepatobiliary: Normal hepatic contour. There is a minimal amount of focal fatty infiltration adjacent to the fissure for ligamentum teres. No discrete hepatic lesions. Normal appearance of the gallbladder given degree of distention. No radiopaque gallstones. No intra or extrahepatic biliary duct dilatation. No ascites. Pancreas: Normal appearance of the pancreas. No peripancreatic stranding. Spleen: Normal appearance of the spleen. Adrenals/Urinary Tract: There is symmetric enhancement and excretion of the bilateral kidneys. No definite renal stones on this postcontrast examination. No discrete renal lesions. No urinary obstruction or perinephric stranding. Normal appearance of the urinary bladder given degree distention. Normal appearance of the bilateral adrenal glands. Stomach/Bowel: Ingested enteric contrast extends to the level of the distal small bowel. Moderate colonic stool burden without evidence of enteric obstruction. The  bowel is normal in course and caliber without wall thickening. Normal appearance of the terminal ileum and retrocecal appendix. No pneumoperitoneum, pneumatosis or portal venous gas. Vascular/Lymphatic: Moderate amount of mixed calcified and noncalcified atherosclerotic plaque within a normal caliber abdominal aorta. The major branch vessels of the abdominal aorta appear widely patent on this non CTA examination. No bulky retroperitoneal, mesenteric, pelvic or inguinal lymphadenopathy. Reproductive: Normal appearance of the pelvic organs. No discrete adnexal lesion. No free fluid in the pelvic cul-de-sac. Other: Regional soft tissues appear normal. Musculoskeletal: No acute or aggressive osseous abnormalities. IMPRESSION: 1. No explanation for patient's right and left upper quadrant abdominal pain. Specifically, no CT evidence of acute cholecystitis, urinary or enteric obstruction. Normal appearance of the appendix. 2. Moderate amount of atherosclerotic plaque within a normal caliber abdominal aorta. Electronically Signed   By: Simonne ComeJohn  Watts M.D.   On: 06/19/2015 18:06   I have personally reviewed and evaluated these  images and lab results as part of my medical decision-making. Chest x ray shows normal cardiac silhouette, no consolidation, effusion, or evidence of pneumothorax noted, unchanged from previous chest x-ray. Abdominal US reveals gallbladder sludge, no identifiable gallstones, abdominal aorta diameter within normal limits. CT of abdomen and pelvis shows normal appearing gallbladder, no evidence of gallstones, no dilation of common bile duct, normal appearing pancreas and kidneys, and stool noted in colon. Evaluation in line with radiology interpretation.     EKG Interpretation None     Rate 93bpm, NSR, PR interval , QRS , QTc . Normal axis. No hypertrophy noted. Wandering baseline, low voltage. No BBB. No STsegment/t wave changes. Rate is faster, PR interval is decreased, and QRS and  QTc increased compared to EKG in 2013.  MDM   Final diagnoses:  Abdominal pain, unspecified abdominal location  Nausea    Patient presents to ED with complaint of RUQ pain, fever, nausea, and vomiting. Vital signs stable. Dry mucous membranes, ketones on urinalysis, and hyaline casts on microscopic exam suggest dehydration - patient received 2L fluids. Considered cardiopulmonary etiologies due to complaint of SOB, palpitations, right sided chest pain, PMH of high cholesterol and diabetes, with subjective history of MI years ago (I reviewed previous records - she was admitted in 2011 for atypical chest pain, MI ruled out at the time and encouraged [facility] - [facility]). Troponin negative. Heart score 2, risk of MACE 0.9-1.7%. Well's Criteria 1.5. CXR negative for acute cardiopulmonary process. EKG shows 93bpm, NSR, normal axis, no hypertrophy, normal intervals, no BBB or ST segment/t-wave changes. Normal diameter of abdominal aorta noted on [place] - less likely AAA.   With RUQ and LLQ tenderness on exam considered abdominal etiologies to include cholecystitis, pancreatitis, diverticulitis, obstruction, nephrolithiasis, appendicitis, mesenteric ischemia, pyelonephritis. Patient is afebrile. Lipase normal. Mild elevation of WBC at 11.9K; however, lactate normal. Elevation could be stress response and/or associated illness - patient reports 100.4 fever until 06/18/2015. Negative urine pregnancy.Glucose in urine; however, review of previous labs reveal consistent glucosuria, patient also taking SGLT-2. Some protein in urine, this is new - secondary to diabetes.  Bacteria on microscopic UA exam, squamous epithelial cells and yeast also noted - no urinary symptoms, ?possible contaminated specimen. US showing gallbladder sludge, but no stones; mildly fatty liver. CT negative for an acute surgical abdomen. Stool noted in colon. She was able to drink powerade prior to coming into ED and able to keep  down. On re-evaluation patient endorsed improvement in abdominal discomfort. Abdominal symptoms most likely GI etiology. Provided patient with Zofran to take at home for relief of nausea. Discussed return precautions. Encouraged follow up with PCP in the next 3 days for re-evaluation and further management. Patient voiced understanding and is agreeable.      [address] [place], [place] 06/20/15 1191  Eber Hong, MD 06/21/15 [phone]

## 2015-06-19 NOTE — ED Notes (Signed)
Pt c/o abd pain in luq and Ruq radiation around to her back.  Reports was sent here for eval of gall bladder.  Reports has had pain since Saturday. Reports vomited Sunday, none since.  Denies diarrhea.

## 2015-06-19 NOTE — Discharge Instructions (Signed)

## 2015-06-21 ENCOUNTER — Telehealth (HOSPITAL_COMMUNITY): Payer: Self-pay | Admitting: Physical Therapy

## 2015-06-21 ENCOUNTER — Ambulatory Visit (HOSPITAL_COMMUNITY): Payer: No Typology Code available for payment source | Attending: Family Medicine | Admitting: Physical Therapy

## 2015-06-21 DIAGNOSIS — R2689 Other abnormalities of gait and mobility: Secondary | ICD-10-CM | POA: Insufficient documentation

## 2015-06-21 DIAGNOSIS — M25551 Pain in right hip: Secondary | ICD-10-CM | POA: Insufficient documentation

## 2015-06-21 DIAGNOSIS — M25651 Stiffness of right hip, not elsewhere classified: Secondary | ICD-10-CM | POA: Insufficient documentation

## 2015-06-21 DIAGNOSIS — M25652 Stiffness of left hip, not elsewhere classified: Secondary | ICD-10-CM | POA: Insufficient documentation

## 2015-06-21 DIAGNOSIS — M6281 Muscle weakness (generalized): Secondary | ICD-10-CM | POA: Insufficient documentation

## 2015-06-21 DIAGNOSIS — M25552 Pain in left hip: Secondary | ICD-10-CM | POA: Insufficient documentation

## 2015-06-21 NOTE — ED Provider Notes (Signed)
Called patient today at 6pm. Informed patient yeast was in urine, [facility] Sig 200mg  Diflucan on day 1 and 100mg  diflucan day 2-10. Patient will see if sister is able to pick up medication.   Lona KettleAshley Laurel Destaney Sarkis, PA-C 06/21/15 1802  Eber HongBrian Miller, MD 06/22/15 1239

## 2015-06-21 NOTE — Telephone Encounter (Signed)
Pt did not show.  Unable to leave voicemail. Lurena NidaAmy B Tekia [place], PTA/CLT [phone]

## 2015-06-25 MED FILL — Ondansetron HCl Tab 4 MG: ORAL | Qty: 4 | Status: AC

## 2015-06-26 ENCOUNTER — Ambulatory Visit (HOSPITAL_COMMUNITY): Payer: No Typology Code available for payment source | Admitting: Physical Therapy

## 2015-06-26 DIAGNOSIS — M25651 Stiffness of right hip, not elsewhere classified: Secondary | ICD-10-CM

## 2015-06-26 DIAGNOSIS — R2689 Other abnormalities of gait and mobility: Secondary | ICD-10-CM | POA: Diagnosis present

## 2015-06-26 DIAGNOSIS — M25552 Pain in left hip: Secondary | ICD-10-CM | POA: Diagnosis present

## 2015-06-26 DIAGNOSIS — M25652 Stiffness of left hip, not elsewhere classified: Secondary | ICD-10-CM

## 2015-06-26 DIAGNOSIS — M6281 Muscle weakness (generalized): Secondary | ICD-10-CM | POA: Diagnosis present

## 2015-06-26 DIAGNOSIS — M25551 Pain in right hip: Secondary | ICD-10-CM | POA: Diagnosis present

## 2015-06-26 NOTE — Patient Instructions (Signed)
Stretches (hold 30 sec, repeat 3x):  1.  Standing hip adductor stretch 2.  Standing hamstring stretch 3.  Supine hip flexor stretch 4.  Supine piriformis stretch  Wall squats (2x15 reps) Free standing squats (2x15 reps) Bridge hold with alt knee extension (2x15 reps)

## 2015-06-26 NOTE — Therapy (Addendum)
[name] [name], [place], [address] Phone: [phone]   Fax:  [phone]  Physical Therapy Treatment/Discharge  Patient Details  Name: [name] [name] MRN: [id number] Date of Birth: 30-Jul-1965 Referring Provider: Allena Earing  Encounter Date: 06/26/2015      PT End of Session - 06/26/15 1034    Visit Number 8   Number of Visits 13   Date for PT Re-Evaluation 06/27/15   Authorization Type [facility] Choice   Authorization Time Period 05/16/2015 to 06/27/2015 Reassessed at 7th visit   Authorization - Visit Number 8   Authorization - Number of Visits 8   PT Start Time [phone]   PT Stop Time 1030   PT Time Calculation (min) 35 min   Activity Tolerance Patient tolerated treatment well   Behavior During Therapy [facility] for tasks assessed/performed      Past Medical History  Diagnosis Date  . Hyperlipidemia   . Diabetes mellitus without complication ([place])   . Allergy   . GERD (gastroesophageal reflux disease)     No past surgical history on file.  There were no vitals filed for this visit.      Subjective Assessment - 06/26/15 1000    Subjective Pt states she was doing great after her last session and was able to walk for several days without any pain and "felt great". She began vomiting on Sunday and ended up going to the ED for her gallbladder and dehydration. She is feeling much better now. She did her HEP while on vacation.    Pertinent History DM and BPPV/vertigo    Limitations --  None   How long can you sit comfortably? unlimited   How long can you stand comfortably? unlimited   How long can you walk comfortably? unlimited   Diagnostic tests x-rays= degeneration of the L hip    Patient Stated Goals Pt's goal is to reduce B hip pain and improve her hip mobility.    Currently in Pain? No/denies   Pain Score 1   due to weather   Pain Location Hip   Pain Orientation Right;Left   Pain Descriptors / Indicators Aching    Pain Type Chronic pain   Pain Onset More than a month ago   Pain Frequency Constant   Aggravating Factors  weather    Pain Relieving Factors HEP   Effect of Pain on Daily Activities None            [facility] PT Assessment - 06/26/15 0001    Assessment   Medical Diagnosis B hip pain / hip OA   Referring Provider Soheir Boshra   Onset Date/Surgical Date 04/17/13   Hand Dominance Right   Next MD Visit [facility]= August 2017   Prior Therapy None for her current complaints   Precautions   Precautions None   Restrictions   Weight Bearing Restrictions No   Balance Screen   Has the patient fallen in the past 6 months No   Has the patient had a decrease in activity level because of a fear of falling?  No   Is the patient reluctant to leave their home because of a fear of falling?  No   Prior Function   Level of Independence Independent   Cognition   Overall Cognitive Status Within Functional Limits for tasks assessed   Sensation   Light Touch Appears Intact   AROM   Right Hip Extension 10   Right Hip Flexion 120  Right Hip External Rotation  30   Right Hip Internal Rotation  30   Right Hip ABduction 30   Left Hip Extension 10   Left Hip Flexion 125   Left Hip External Rotation  40   Left Hip Internal Rotation  30   Left Hip ABduction 30   Strength   Right Hip Flexion 5/5   Right Hip Extension 5/5   Right Hip ABduction 5/5   Right Hip ADduction 5/5   Left Hip Flexion 5/5   Left Hip Extension 5/5   Left Hip ABduction 5/5   Left Hip ADduction 4+/5   Right Knee Flexion 5/5   Right Knee Extension 5/5   Left Knee Flexion 5/5   Left Knee Extension 5/5   Right Ankle Dorsiflexion 5/5   Left Ankle Dorsiflexion 5/5   Flexibility   Soft Tissue Assessment /Muscle Length yes   Hamstrings HS 90/90 (L/R)= -10 degrees /-12 degrees    Quadriceps Thomas test= Positive for moderate tightness of B quads/hip flexors    ITB Obers=Postive for tightness, bilaterally   Palpation    Palpation comment --                     [facility] Adult PT Treatment/Exercise - 06/26/15 0001    Knee/Hip Exercises: Stretches   Hip Flexor Stretch 3 reps;30 seconds;Both                PT Education - 06/26/15 1032    Education provided Yes   Education Details reviewed goals and progress; discussed continued HEP adherence for improved strenght/flexibility; discussed benefits of yoga and encouraged pt to try it at home.    Person(s) Educated Patient   Methods Explanation;Demonstration;Handout   Comprehension Verbalized understanding;Returned demonstration          PT Short Term Goals - 06/26/15 1020    PT SHORT TERM GOAL #1   Title Patient will independently verbalize and demo proper completion of her initial HEP to continue with LE strengthening at home.    Time 2   Period Weeks   Status Achieved   PT SHORT TERM GOAL #2   Title Patient will report decreased max B hip pain to 6/10 on a VAS in order to improve her ability to ambulate in the community with less limitations.    Baseline 3/10 durind bad weather, 0/10 when weather is not bad   Time 3   Period Weeks   Status Achieved   PT SHORT TERM GOAL #3   Title Patient will demo improved B hip AROM/PROM by 5-10 degrees in all directions in order to improve B hip mobility and ROM with stair climbing and leisure activities.     Time 3   Period Weeks   Status Achieved           PT Long Term Goals - 06/26/15 1020    PT LONG TERM GOAL #1   Title Patient will independently verbalize and demo proper completion of her advanced HEP to continue with LE strengthening at home once DC.    Time 6   Period Weeks   Status Achieved   PT LONG TERM GOAL #2   Title Patient will demo improved B hip AROM/PROM by 10-15 degrees in all directions in order to improve B hip mobility in order to sit with legs crossed and be able to donn/doff socks/shoes with less difficulty.    Time 6   Period Weeks   Status Partially Met  PT LONG TERM GOAL #3   Title Patient will demo improved B hip strength to >4/5 MMT in order to improve performance with outdoor ambulation and transfers.   Time 6   Period Weeks   Status Achieved   PT LONG TERM GOAL #4   Title Patient will improve her FOTO to <30% in order to progress towards her PLOF with leisure activities.    Time 6   Period Weeks   PT LONG TERM GOAL #5   Title Patient will report decreased B hip pain to 0-4/10 on a VAS in order to improve her ability to ambulate in the community with less limitations.    Baseline 3/10 during bad weather   Time 6   Period Weeks   Status Achieved   PT LONG TERM GOAL #6   Title Pt will demonstrate improved mobility and pain relief evident by her ability to return to walking exercise for atleast 59mn without requiring a break due to pain.    Time 2   Period Weeks   Status Achieved               Plan - 06/26/15 1034    Clinical Impression Statement Pt was reassessed this session with noted improvements in remaining impairments. She demonstrates improved hip pain as well as increased BLE strength, flexibility and functional performance. Pt is now back to full activity, having reported she went for a 2 mile walk this past weekend without any issues. She is independent and consistently performing her HEP. At this time, she no longer requires skilled PT services to address minor limitations in hip flexor and quadriceps flexibility. This will continue to improve with HEP adherence at home. Therapist provided pt with advanced home management program and discussed benefits of yoga to improve stability and flexibility. Pt verbalized/demonstrated understanding and is in agreement with discharge from PT at this time.    Rehab Potential Good   PT Frequency 2x / week   PT Duration 6 weeks   PT Treatment/Interventions ADLs/Self Care Home Management;Cryotherapy;EEnglish as a second language teacherTherapeutic  activities;Therapeutic exercise;Balance training;Neuromuscular re-education;Patient/family education;Manual techniques;Passive range of motion;Taping   PT Next Visit Plan discharged   PT Home Exercise Plan final HEP given   Consulted and Agree with Plan of Care Patient      Patient will benefit from skilled therapeutic intervention in order to improve the following deficits and impairments:  Abnormal gait, Decreased strength, Difficulty walking, Impaired flexibility, Hypomobility, Decreased range of motion, Pain  Visit Diagnosis: Pain in left hip  Muscle weakness (generalized)  Stiffness of right hip, not elsewhere classified  Other abnormalities of gait and mobility  Stiffness of left hip, not elsewhere classified  Pain in right hip    PHYSICAL THERAPY DISCHARGE SUMMARY  Visits from Start of Care: 8  Current functional level related to goals / functional outcomes: Pt is independent with advanced HEP, strength 5/5 MMT, improved hip flexibility and no greater than 2/10 pain report.   Remaining deficits: (+) thomas test for hip flexor and quadriceps flexibility which will continue to improve with HEP adherence   Education / Equipment: Discussed benefits of yoga; advanced HEP given Plan: Patient agrees to discharge.  Patient goals were met. Patient is being discharged due to meeting the stated rehab goals.  ?????        Problem List Patient Active Problem List   Diagnosis Date Noted  . DIABETES MELLITUS 04/29/2007  . HYPERCHOLESTEROLEMIA 04/29/2007  . ASTHMA 04/29/2007  . ALLERGY 04/29/2007  12:15 PM,06/26/2015 Elly Modena PT, DPT [facility] [place] [address] [place], [place], [address] Phone: [phone]   Fax:  [phone]  Name: EMALI HEYWARD MRN: [id number] Date of Birth: 10-17-1965    * Addendum made to close encounter   12:21 PM,06/26/2015 Elly Modena  PT, [facility] [phone]

## 2015-08-08 ENCOUNTER — Ambulatory Visit (INDEPENDENT_AMBULATORY_CARE_PROVIDER_SITE_OTHER): Payer: Self-pay | Admitting: Family Medicine

## 2015-08-08 VITALS — BP 120/68 | HR 66 | Temp 98.7°F | Resp 18 | Ht 65.0 in | Wt 154.0 lb

## 2015-08-08 DIAGNOSIS — Z021 Encounter for pre-employment examination: Secondary | ICD-10-CM

## 2015-08-08 DIAGNOSIS — Z024 Encounter for examination for driving license: Secondary | ICD-10-CM

## 2015-08-08 NOTE — Progress Notes (Signed)
By signing my name below, I, [name] [name], attest that this documentation has been prepared under the direction and in the presence of [name] Staggers, MD.  Electronically Signed: Arvilla [name], Medical Scribe. 08/08/2015. 5:55 PM.  Subjective:    Patient ID: [name] [name], female    DOB: 1965/05/13, 50 y.o.   MRN: [id number]  HPI Chief Complaint  Patient presents with  . Employment Physical    HPI Comments: [name] [name] is a 50 y.o. 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 Previous card was good for 1 year due to PMHx of DM.   DM: Pt takes Metformin daily, and Glipizide along with Farxiga PRN based on her readings. Pts blood sugar readings was 121 this morning, and yesterday was in the 90s. Her blood sugars depend on what she eats, and when she eats. Pt denies having any symptomatic lows. Pt's highest reading was 131 with a big dinner- she also had pie that night. Not on insulin. The last time she checked her A1c check was in March with a reading of reading 6.1; the reading before that was 5.8. She's able to cut back on medications because of this. Pt denies having complications with her DM- no neuropathy. Pt is watching what she eats.  Vision: Pt denies having glaucoma or any pressure in the back of her eyes  Visual Acuity Screening   Right eye Left eye Both eyes  Without correction:     With correction: 20/25 20/25 20/25-1  Comments: Titmus test was 85 degrees in both eyes  Hearing Screening Comments: Whisper test was 45ft in both ears.  HLD: Pt denies having any side affects with her medication.  Muscles: Pt had physical therapy for her hip due to pain. Pt denies weakness in her arms or legs. Pt denies having chest pain. Pt denies chest pain or chest tightness on exertion.  Vertigo: Pt states the the [employer] fixed it after 1 therapy session.  Gall bladder: Pt states her gallbladder had some issues a month ago. Pt  no longer has issues. Pt denies abdominal pain and urinary issues.  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  Patient Active Problem List   Diagnosis Date Noted  . DIABETES MELLITUS 04/29/2007  . HYPERCHOLESTEROLEMIA 04/29/2007  . ASTHMA 04/29/2007  . ALLERGY 04/29/2007   Past Medical History  Diagnosis Date  . Hyperlipidemia   . Diabetes mellitus without complication ([facility])   . Allergy   . GERD (gastroesophageal reflux disease)    History reviewed. No pertinent past surgical history. Allergies  Allergen Reactions  . Sulfa Antibiotics Rash   Prior to Admission medications   Medication Sig Start Date End Date Taking? Authorizing Provider  glipiZIDE (GLUCOTROL) 10 MG tablet Take 10 mg by mouth daily before breakfast.   Yes Historical Provider, MD  metFORMIN (GLUCOPHAGE) 1000 MG tablet Take 500 mg by mouth 2 (two) times daily with a meal. Reported on 08/08/2015   Yes Historical Provider, MD  simvastatin (ZOCOR) 20 MG tablet Take 20 mg by mouth daily. Reported on 08/08/2015   Yes Historical Provider, MD  Cholecalciferol (VITAMIN D PO) Take 1 tablet by mouth daily. Reported on  08/08/2015    Historical Provider, MD  Cyanocobalamin (VITAMIN B-12 PO) Take 1 tablet by mouth every other day. Reported on 08/08/2015    Historical Provider, MD  dapagliflozin propanediol (FARXIGA) 5 MG TABS tablet Take 5 mg by mouth daily. Reported on 08/08/2015    Historical Provider, MD  omeprazole (PRILOSEC) 40 MG capsule Take 40 mg by mouth daily. Reported on 08/08/2015    Historical Provider, MD  ondansetron (ZOFRAN ODT) 4 MG disintegrating tablet Take 1 tablet (4 mg total) by mouth every 8 (eight) hours as needed for nausea or vomiting. Patient not taking: Reported on 08/08/2015 06/19/15    [name] KettleAshley Laurel Meyer, PA-C   Social History   Social History  . Marital Status: Single    Spouse Name: N/A  . Number of Children: N/A  . Years of Education: N/A   Occupational History  . Not on file.   Social History Main Topics  . Smoking status: Former Games developermoker  . Smokeless tobacco: Not on file     Comment: quit 1997  . Alcohol Use: No  . Drug Use: No  . Sexual Activity: Not on file   Other Topics Concern  . Not on file   Social History Narrative   Review of Systems  Constitutional: Negative for fatigue.  Eyes: Negative for visual disturbance.  Respiratory: Negative for shortness of breath.   Cardiovascular: Negative for chest pain.  Gastrointestinal: Negative for abdominal pain.  Genitourinary: Negative for difficulty urinating.  Neurological: Negative for weakness and numbness.  Psychiatric/Behavioral: Negative for sleep disturbance.   See DOT paperwork - reviewed form.   Objective:  BP 120/68 mmHg  Pulse 66  Temp(Src) 98.7 F (37.1 C) (Oral)  Resp 18  Ht 5\' 5"  (1.651 m)  Wt 154 lb (69.854 kg)  BMI 25.63 kg/m2  SpO2 97%  LMP  (LMP Unknown)  Physical Exam  Constitutional: She is oriented to person, place, and time. She appears well-developed and well-nourished.  HENT:  Head: Normocephalic and atraumatic.  Right Ear: External ear normal.  Left Ear: External ear normal.  Mouth/Throat: Oropharynx is clear and moist.  Eyes: Conjunctivae are normal. Pupils are equal, round, and reactive to light.  Neck: Normal range of motion. Neck supple. No thyromegaly present.  Cardiovascular: Normal rate, regular rhythm, normal heart sounds and intact distal pulses.   No murmur heard. Pulmonary/Chest: Effort normal and breath sounds normal. No respiratory distress. She has no wheezes.  Abdominal: Soft. Bowel sounds are normal. There is no tenderness.  Musculoskeletal: Normal range of motion. She exhibits no edema or tenderness.  Lymphadenopathy:    She has no cervical  adenopathy.  Neurological: She is alert and oriented to person, place, and time.  Skin: Skin is warm and dry. No rash noted.  Psychiatric: She has a normal mood and affect. Her behavior is normal. Thought content normal.      Assessment & Plan:   [name] BadgerDeborah D Sick is a 50 y.o. female Encounter for commercial driver medical examination (CDME)  -DM2, well controlled.  No hypoglycemia, no new side effects of meds. No complications.   -1 year card, with corrective lenses. See DOT paperwork.    Meds ordered this encounter  Medications  . glipiZIDE (GLUCOTROL) 10 MG tablet    Sig: Take 10 mg by mouth daily before breakfast.   Patient Instructions       IF you received an x-ray today, you will receive an invoice from [facility]. Please contact [facility] at [phone] with  questions or concerns regarding your invoice.   IF you received labwork today, you will receive an invoice from [employer] [facility]. Please contact [facility] at [phone] with questions or concerns regarding your invoice.   Our billing staff will not be able to assist you with questions regarding bills from these companies.  You will be contacted with the lab results as soon as they are available. The fastest way to get your results is to activate your My Chart account. Instructions are located on the last page of this paperwork. If you have not heard from [place] regarding the results in 2 weeks, please contact this office.    One year card for DOT based on diabetes.     I personally performed the services described in this documentation, which was scribed in my presence. The recorded information has been reviewed and considered, and addended by me as needed.   Signed,   [name] Staggers, MD [facility].  08/08/2015 7:55 PM

## 2015-08-08 NOTE — Patient Instructions (Addendum)
     IF you received an x-ray today, you will receive an invoice from [facility]. Please contact [facility] at [phone] with questions or concerns regarding your invoice.   IF you received labwork today, you will receive an invoice from [facility]. Please contact [facility] at [phone] with questions or concerns regarding your invoice.   Our billing staff will not be able to assist you with questions regarding bills from these companies.  You will be contacted with the lab results as soon as they are available. The fastest way to get your results is to activate your My Chart account. Instructions are located on the last page of this paperwork. If you have not heard from us regarding the results in 2 weeks, please contact this office.    One year card for DOT based on diabetes.

## 2016-08-12 ENCOUNTER — Encounter: Payer: Self-pay | Admitting: Urgent Care

## 2016-08-12 ENCOUNTER — Ambulatory Visit (INDEPENDENT_AMBULATORY_CARE_PROVIDER_SITE_OTHER): Payer: Self-pay | Admitting: Urgent Care

## 2016-08-12 VITALS — BP 105/69 | HR 91 | Temp 98.1°F | Resp 16 | Ht 65.0 in | Wt 179.6 lb

## 2016-08-12 DIAGNOSIS — R81 Glycosuria: Secondary | ICD-10-CM

## 2016-08-12 DIAGNOSIS — E1165 Type 2 diabetes mellitus with hyperglycemia: Secondary | ICD-10-CM

## 2016-08-12 DIAGNOSIS — Z024 Encounter for examination for driving license: Secondary | ICD-10-CM

## 2016-08-12 DIAGNOSIS — IMO0001 Reserved for inherently not codable concepts without codable children: Secondary | ICD-10-CM

## 2016-08-12 DIAGNOSIS — E782 Mixed hyperlipidemia: Secondary | ICD-10-CM

## 2016-08-12 LAB — POCT GLYCOSYLATED HEMOGLOBIN (HGB A1C): Hemoglobin A1C: 8.9

## 2016-08-12 NOTE — Progress Notes (Signed)
  Commercial Driver Medical Examination   [name] [name] is a 51 y.o. female who presents today for a DOT physical exam. Patient has Type 2 DM, managed with 3 medications, not on insulin therapy. Has mixed hypelipidemia. Also takes Vitamin D, B12 and calcium supplements. Denies smoking cigarettes or drinking alcohol. Quit smoking in 1996. Has history of fractured fifth finger and patella, both without sequelae.  The following portions of the patient's history were reviewed and updated as appropriate: allergies, current medications, past family history, past medical history, past social history and past surgical history.  Objective:   BP 105/69 (BP Location: Right Arm, Patient Position: Sitting, Cuff Size: Large)   Pulse 91   Temp 98.1 F (36.7 C) (Oral)   Resp 16   Ht 5\' 5"  (1.651 m)   Wt 179 lb 9.6 oz (81.5 kg)   SpO2 95%   BMI 29.89 kg/m   Vision/hearing:  Visual Acuity Screening   Right eye Left eye Both eyes  Without correction:     With correction: 20/20 20/20 20/20   Comments: Periph: R eye 85 degrees  L eye 85 degrees Colors: 6/6  Hearing Screening Comments: Whisper test:   L ear 810ft  R ear 10 ft  Patient can recognize and distinguish among traffic control signals and devices showing standard red, green, and amber colors.  Corrective lenses required: Yes  Monocular Vision?: No  Hearing aid requirement: No  Physical Exam  Constitutional: She is oriented to person, place, and time. She appears well-developed and well-nourished.  HENT:  TM's intact bilaterally, no effusions or erythema. Nasal turbinates pink and moist, nasal passages patent. No sinus tenderness. Oropharynx clear, mucous membranes moist, dentition in good repair.  Eyes: Conjunctivae and EOM are normal. Pupils are equal, round, and reactive to light. Right eye exhibits no discharge. Left eye exhibits no discharge. No scleral icterus.  Neck: Normal range of motion. Neck supple.  Cardiovascular: Normal  rate, regular rhythm and intact distal pulses.  Exam reveals no gallop and no friction rub.   No murmur heard. Pulmonary/Chest: No respiratory distress. She has no wheezes. She has no rales.  Abdominal: Soft. Bowel sounds are normal. She exhibits no distension and no mass. There is no tenderness.  Musculoskeletal: Normal range of motion. She exhibits no edema or tenderness.  Lymphadenopathy:    She has no cervical adenopathy.  Neurological: She is alert and oriented to person, place, and time. She has normal reflexes. She displays normal reflexes.  Skin: Skin is warm and dry. No rash noted. No erythema. No pallor.  Psychiatric: She has a normal mood and affect.    Labs: Comments: U/A:  sp-1.010, blood-neg, glucose-2000 protein-neg  Results for orders placed or performed in visit on 08/12/16 (from the past 24 hour(s))  POCT glycosylated hemoglobin (Hb A1C)     Status: None   Collection Time: 08/12/16  4:06 PM  Result Value Ref Range   Hemoglobin A1C 8.9    Assessment:    Healthy female exam.  Meets standards, but periodic monitoring required due to diabetes.  Driver qualified only for 1 year.    Plan:   Medical examiners certificate completed and printed. Return as needed.  [name] BambergMario Aria Jarrard, PA-C [facility] [phone] 08/12/2016  3:52 PM

## 2016-08-12 NOTE — Addendum Note (Signed)
Addended by: Wallis BambergMANI, Chanse Kagel on: 08/12/2016 04:30 PM   Modules accepted: Orders

## 2016-08-12 NOTE — Patient Instructions (Signed)

## 2017-09-04 ENCOUNTER — Encounter: Payer: Self-pay | Admitting: Urgent Care

## 2017-09-04 ENCOUNTER — Other Ambulatory Visit: Payer: Self-pay

## 2017-09-04 ENCOUNTER — Ambulatory Visit: Payer: Self-pay | Admitting: Urgent Care

## 2017-09-04 VITALS — BP 116/76 | HR 65 | Temp 98.2°F | Ht 65.0 in | Wt 177.2 lb

## 2017-09-04 DIAGNOSIS — E782 Mixed hyperlipidemia: Secondary | ICD-10-CM

## 2017-09-04 DIAGNOSIS — IMO0001 Reserved for inherently not codable concepts without codable children: Secondary | ICD-10-CM

## 2017-09-04 DIAGNOSIS — R81 Glycosuria: Secondary | ICD-10-CM

## 2017-09-04 DIAGNOSIS — E1165 Type 2 diabetes mellitus with hyperglycemia: Secondary | ICD-10-CM

## 2017-09-04 DIAGNOSIS — Z024 Encounter for examination for driving license: Secondary | ICD-10-CM

## 2017-09-04 NOTE — Patient Instructions (Signed)
     IF you received an x-ray today, you will receive an invoice from [facility]. Please contact [facility] at [phone] with questions or concerns regarding your invoice.   IF you received labwork today, you will receive an invoice from [facility]. Please contact [facility] at [phone] with questions or concerns regarding your invoice.   Our billing staff will not be able to assist you with questions regarding bills from these companies.  You will be contacted with the lab results as soon as they are available. The fastest way to get your results is to activate your My Chart account. Instructions are located on the last page of this paperwork. If you have not heard from us regarding the results in 2 weeks, please contact this office.     

## 2017-09-04 NOTE — Progress Notes (Signed)
    Commercial Driver Medical Examination   [name] BadgerDeborah D [name] is a 52 y.o. female who presents today for a DOT physical exam. The patient reports diabetes managed with metformin, farxiga and glipizide.  She also takes Zocor for cholesterol, Prilosec for GERD.  She has a history of left pinky fracture, right patellar fracture without sequelae.  Has a history of hospitalization for tonsillectomy.  [facility]. Denies smoking cigarettes or drinking alcohol. Denies dizziness, chronic headache, blurred vision, chest pain, shortness of breath, heart racing, palpitations, nausea, vomiting, abdominal pain, hematuria, lower leg swelling.   The following portions of the patient's history were reviewed and updated as appropriate: allergies, current medications, past family history, past medical history, past social history and past surgical history.  Objective:   BP 116/76 (BP Location: Left Arm, Patient Position: Sitting, Cuff Size: Normal)   Pulse 65   Temp 98.2 F (36.8 C) (Oral)   Ht 5\' 5"  (1.651 m)   Wt 177 lb 3.2 oz (80.4 kg)   SpO2 97%   BMI 29.49 kg/m   Vision/hearing:  Visual Acuity Screening   Right eye Left eye Both eyes  Without correction:     With correction: 20/20-2 20/20-1 20/20-1  Comments: Peripheral Vision: Right eye 85 degrees. Left eye 85 degrees.  The patient can distinguish the colors red, amber and green.  Hearing Screening Comments: The patient was able to hear a forced whisper from 9 feet.  Patient can recognize and distinguish among traffic control signals and devices showing standard red, green, and amber colors.  Corrective lenses required: Yes  Monocular Vision?: No  Hearing aid requirement: No  Physical Exam  Constitutional: She is oriented to person, place, and time. She appears well-developed and well-nourished.  HENT:  TM's intact bilaterally, no effusions or erythema. Nasal turbinates pink and moist, nasal  passages patent. No sinus tenderness. Oropharynx clear, mucous membranes moist, dentition in good repair.  Eyes: Pupils are equal, round, and reactive to light. Conjunctivae and EOM are normal. Right eye exhibits no discharge. Left eye exhibits no discharge. No scleral icterus.  Neck: Normal range of motion. Neck supple.  Cardiovascular: Normal rate, regular rhythm, normal heart sounds and intact distal pulses. Exam reveals no gallop and no friction rub.  No murmur heard. Pulmonary/Chest: Effort normal and breath sounds normal. No stridor. No respiratory distress. She has no wheezes. She has no rales.  Abdominal: Soft. Bowel sounds are normal. She exhibits no distension and no mass. There is no tenderness. There is no rebound and no guarding.  Musculoskeletal: Normal range of motion. She exhibits no edema or tenderness.  Lymphadenopathy:    She has no cervical adenopathy.  Neurological: She is alert and oriented to person, place, and time. She has normal reflexes. She displays normal reflexes. Coordination normal.  Skin: Skin is warm and dry. No rash noted. No erythema. No pallor.  Psychiatric: She has a normal mood and affect.   Labs: Comments: Pro: Neg  Blood: Neg   SG: 1.020  Glu: 500mg /dl  Assessment:    Healthy female exam.  Meets standards, but periodic monitoring required due to DM.  Driver qualified only for 1 year.    Plan:   Medical examiners certificate completed and printed. Return as needed.  Wallis BambergMario Manav Pierotti, PA-C [facility] [phone] 09/04/2017  4:21 PM

## 2020-06-18 ENCOUNTER — Emergency Department (HOSPITAL_COMMUNITY): Payer: No Typology Code available for payment source

## 2020-06-18 ENCOUNTER — Other Ambulatory Visit: Payer: Self-pay

## 2020-06-18 ENCOUNTER — Emergency Department (HOSPITAL_COMMUNITY)
Admission: EM | Admit: 2020-06-18 | Discharge: 2020-06-18 | Disposition: A | Discharge status: Home / Self Care | Payer: No Typology Code available for payment source | Attending: Emergency Medicine | Admitting: Emergency Medicine

## 2020-06-18 ENCOUNTER — Encounter (HOSPITAL_COMMUNITY): Payer: Self-pay

## 2020-06-18 DIAGNOSIS — M542 Cervicalgia: Secondary | ICD-10-CM | POA: Diagnosis not present

## 2020-06-18 DIAGNOSIS — E119 Type 2 diabetes mellitus without complications: Secondary | ICD-10-CM | POA: Diagnosis not present

## 2020-06-18 DIAGNOSIS — R079 Chest pain, unspecified: Secondary | ICD-10-CM | POA: Diagnosis present

## 2020-06-18 DIAGNOSIS — R519 Headache, unspecified: Secondary | ICD-10-CM | POA: Diagnosis not present

## 2020-06-18 DIAGNOSIS — Z7984 Long term (current) use of oral hypoglycemic drugs: Secondary | ICD-10-CM | POA: Diagnosis not present

## 2020-06-18 DIAGNOSIS — Z87891 Personal history of nicotine dependence: Secondary | ICD-10-CM | POA: Diagnosis not present

## 2020-06-18 DIAGNOSIS — J45909 Unspecified asthma, uncomplicated: Secondary | ICD-10-CM | POA: Diagnosis not present

## 2020-06-18 DIAGNOSIS — R209 Unspecified disturbances of skin sensation: Secondary | ICD-10-CM

## 2020-06-18 LAB — CBC WITH DIFFERENTIAL/PLATELET
Abs Immature Granulocytes: 0.05 10*3/uL (ref 0.00–0.07)
Basophils Absolute: 0 10*3/uL (ref 0.0–0.1)
Basophils Relative: 1 %
Eosinophils Absolute: 0.1 10*3/uL (ref 0.0–0.5)
Eosinophils Relative: 2 %
HCT: 42.3 % (ref 36.0–46.0)
Hemoglobin: 14.1 g/dL (ref 12.0–15.0)
Immature Granulocytes: 1 %
Lymphocytes Relative: 29 %
Lymphs Abs: 2.2 10*3/uL (ref 0.7–4.0)
MCH: 32.5 pg (ref 26.0–34.0)
MCHC: 33.3 g/dL (ref 30.0–36.0)
MCV: 97.5 fL (ref 80.0–100.0)
Monocytes Absolute: 0.5 10*3/uL (ref 0.1–1.0)
Monocytes Relative: 6 %
Neutro Abs: 4.7 10*3/uL (ref 1.7–7.7)
Neutrophils Relative %: 61 %
Platelets: 286 10*3/uL (ref 150–400)
RBC: 4.34 MIL/uL (ref 3.87–5.11)
RDW: 12.3 % (ref 11.5–15.5)
WBC: 7.5 10*3/uL (ref 4.0–10.5)
nRBC: 0 % (ref 0.0–0.2)

## 2020-06-18 LAB — COMPREHENSIVE METABOLIC PANEL
ALT: 19 U/L (ref 0–44)
AST: 20 U/L (ref 15–41)
Albumin: 3.8 g/dL (ref 3.5–5.0)
Alkaline Phosphatase: 41 U/L (ref 38–126)
Anion gap: 11 (ref 5–15)
BUN: 9 mg/dL (ref 6–20)
CO2: 23 mmol/L (ref 22–32)
Calcium: 9.2 mg/dL (ref 8.9–10.3)
Chloride: 105 mmol/L (ref 98–111)
Creatinine, Ser: 0.74 mg/dL (ref 0.44–1.00)
GFR, Estimated: >60 mL/min (ref >60)
Glucose, Bld: 172 mg/dL — ABNORMAL HIGH (ref 70–99)
Potassium: 4.1 mmol/L (ref 3.5–5.1)
Sodium: 139 mmol/L (ref 135–145)
Total Bilirubin: 0.8 mg/dL (ref 0.3–1.2)
Total Protein: 6.6 g/dL (ref 6.5–8.1)

## 2020-06-18 LAB — URINALYSIS, ROUTINE W REFLEX MICROSCOPIC
Bilirubin Urine: NEGATIVE
Glucose, UA: >=500 mg/dL — AB
Hgb urine dipstick: NEGATIVE
Ketones, ur: 5 mg/dL — AB
Leukocytes,Ua: NEGATIVE
Nitrite: NEGATIVE
Protein, ur: NEGATIVE mg/dL
Specific Gravity, Urine: 1.028 (ref 1.005–1.030)
pH: 5 (ref 5.0–8.0)

## 2020-06-18 LAB — TROPONIN I (HIGH SENSITIVITY)
Troponin I (High Sensitivity): <2 ng/L (ref <18)
Troponin I (High Sensitivity): <2 ng/L (ref <18)

## 2020-06-18 LAB — CBG MONITORING, ED: Glucose-Capillary: 108 mg/dL — ABNORMAL HIGH (ref 70–99)

## 2020-06-18 LAB — LIPASE, BLOOD: Lipase: 76 U/L — ABNORMAL HIGH (ref 11–51)

## 2020-06-18 MED ORDER — MELOXICAM 7.5 MG PO TABS: 15.0000 mg | ORAL_TABLET | Freq: Every day | ORAL | 0 refills | Status: AC

## 2020-06-18 NOTE — ED Provider Notes (Signed)
Emergency Medicine Provider Triage Evaluation Note  [name] [name] 55 y.o. F  was evaluated in triage.  Pt complains of multiple complaints.  She states that for the last couple days, she has had "a vibration feeling in her legs and feet."  She states it mostly occurs at night.  Almost like spasms.  She also reports she has had some "tingling prickling pain in her chest that has been ongoing for a while."  She states she is also felt like her right upper extremity intermittently swells.  Currently denies any swelling at this time.  States her legs has not been red hot or swollen.  She has not a fever.  She reports that she has also had some pain in her upper abdomen for the last couple days.  No fevers, nausea/vomiting.  Review of Systems  Positive: Muscle spasms, chest pain, abdominal pain Negative: Fevers, shortness of breath  Physical Exam  BP 134/82   Pulse 70   Temp 98.2 F (36.8 C) (Oral)   Resp 18   Ht 5\' 4"  (1.626 m)   Wt 65.8 kg   SpO2 100%   BMI 24.89 kg/m  Gen:   Awake, no distress, nontoxic appearing  HEENT:  Atraumatic  Resp:  Normal effort  Cardiac:  Normal rate. 2+ Radial and DP pulses bilaterally Abd:   Nondistended, nontender.  MSK:   Moves extremities without difficulty  Neuro:  Speech clear   Medical Decision Making  Medically screening exam initiated at 3:55 AM.  Appropriate orders placed.  was informed that the remainder of the evaluation will be completed by another provider, this initial triage assessment does not replace that evaluation, and the importance of remaining in the ED until their evaluation is complete.  Clinical Impression  Spasms   Portions of this note were generated with Dragon dictation software. Dictation errors may occur despite best attempts at proofreading.      Towana Badger, PA-C 06/18/20 1230    08/18/20, MD 06/19/20 1011

## 2020-06-18 NOTE — ED Provider Notes (Signed)
[facility] EMERGENCY DEPARTMENT Provider Note   CSN: [id number] Arrival date & time: 06/18/20  1146     History Chief Complaint  Patient presents with  . Leg Pain    [name] [name] is a 55 y.o. female who presents  With multiple complaints. Patient c/o "my body is vibrating" in her legs, torso and arms. Sensation is more acute at night. She states that when she lays on her left side she has sharp, tingly, pinching pain in her chest and has to "jump out of bed. She also has pain down the Right arm and it sometimes goes to sleep. The patient endorses neck pain and pain along the angle of the right mandilble, which she just noticed. She also tell me her blood pressure was higher than normal when she walked to triage and her blood sugar was in the 140s on her left and 176 in her right hand today. Her sxs have been ongoing for 4 weeks, they have been ascending. She has not had any weakness. She was started on Jardiance back in January and stopped taking it for 1 day to see if her vibratory sensation would stop. It did not improve her sxs. The  Patient denies previous surgery to neck, head or neck injury.   HPI     Past Medical History:  Diagnosis Date  . Allergy   . Diabetes mellitus without complication (HCC)   . Finger fracture, left   . GERD (gastroesophageal reflux disease)   . Hyperlipidemia   . Patellar fracture     Patient Active Problem List   Diagnosis Date Noted  . DIABETES MELLITUS 04/29/2007  . HYPERCHOLESTEROLEMIA 04/29/2007  . ASTHMA 04/29/2007  . ALLERGY 04/29/2007    Past Surgical History:  Procedure Laterality Date  . TONSILLECTOMY       OB History   No obstetric history on file.     Family History  Problem Relation Age of Onset  . Cancer Mother   . Diabetes Mother   . Cancer Father   . Diabetes Father   . Heart disease Father     Social History   Tobacco Use  . Smoking status: Former Games developer  . Smokeless tobacco: Never Used   . Tobacco comment: quit 1997  Substance Use Topics  . Alcohol use: No    Alcohol/week: 0.0 standard drinks  . Drug use: No    Home Medications Prior to Admission medications   Medication Sig Start Date End Date Taking? Authorizing Provider  Cholecalciferol (VITAMIN D PO) Take 1 tablet by mouth daily. Reported on 08/08/2015    [provider]  Cyanocobalamin (VITAMIN B-12 PO) Take 1 tablet by mouth every other day. Reported on 08/08/2015    [provider]  dapagliflozin propanediol (FARXIGA) 5 MG TABS tablet Take 5 mg by mouth daily. Reported on 08/08/2015    [provider]  glipiZIDE (GLUCOTROL) 10 MG tablet Take 10 mg by mouth daily before breakfast.    [provider]  metFORMIN (GLUCOPHAGE) 1000 MG tablet Take 500 mg by mouth 2 (two) times daily with a meal. Reported on 08/08/2015    [provider]  omeprazole (PRILOSEC) 40 MG capsule Take 40 mg by mouth daily. Reported on 08/08/2015    [provider]  simvastatin (ZOCOR) 20 MG tablet Take 20 mg by mouth daily. Reported on 08/08/2015    [provider]    Allergies    Sulfa antibiotics  Review of Systems  Review of Systems Ten systems reviewed and are negative for acute change, except as noted in the HPI.   Physical Exam Updated Vital Signs BP 119/75 (BP Location: Right Arm)   Pulse 73   Temp 98.4 F (36.9 C) (Oral)   Resp 15   Ht 5\' 5"  (1.651 m)   Wt 78 kg   SpO2 96%   BMI 28.62 kg/m   Physical Exam Vitals and nursing note reviewed.  Constitutional:      General: She is not in acute distress.    Appearance: She is well-developed. She is not diaphoretic.  HENT:     Head: Normocephalic and atraumatic.  Eyes:     General: No scleral icterus.    Conjunctiva/sclera: Conjunctivae normal.  Cardiovascular:     Rate and Rhythm: Normal rate and regular rhythm.     Heart sounds: Normal heart sounds. No murmur heard. No friction rub. No gallop.   Pulmonary:      Effort: Pulmonary effort is normal. No respiratory distress.     Breath sounds: Normal breath sounds.  Abdominal:     General: Bowel sounds are normal. There is no distension.     Palpations: Abdomen is soft. There is no mass.     Tenderness: There is no abdominal tenderness. There is no guarding.  Musculoskeletal:     Cervical back: Normal range of motion.  Skin:    General: Skin is warm and dry.  Neurological:     General: No focal deficit present.     Mental Status: She is alert and oriented to person, place, and time.     Cranial Nerves: No cranial nerve deficit.     Sensory: No sensory deficit.     Motor: No weakness.     Coordination: Coordination normal.     Gait: Gait normal.     Deep Tendon Reflexes: Reflexes normal.  Psychiatric:        Behavior: Behavior normal.     ED Results / Procedures / Treatments   Labs (all labs ordered are listed, but only abnormal results are displayed) Labs Reviewed  COMPREHENSIVE METABOLIC PANEL - Abnormal; Notable for the following components:      Result Value   Glucose, Bld 172 (*)    All other components within normal limits  LIPASE, BLOOD - Abnormal; Notable for the following components:   Lipase 76 (*)    All other components within normal limits  URINALYSIS, ROUTINE W REFLEX MICROSCOPIC - Abnormal; Notable for the following components:   Glucose, UA >=500 (*)    Ketones, ur 5 (*)    Bacteria, UA RARE (*)    All other components within normal limits  CBG MONITORING, ED - Abnormal; Notable for the following components:   Glucose-Capillary 108 (*)    All other components within normal limits  CBC WITH DIFFERENTIAL/PLATELET  TROPONIN I (HIGH SENSITIVITY)  TROPONIN I (HIGH SENSITIVITY)    EKG None  Radiology DG Chest 2 View  Result Date: 06/18/2020 CLINICAL DATA:  Chest pain EXAM: CHEST - 2 VIEW COMPARISON:  Jun 19, 2015 FINDINGS: Lungs are clear. Heart size and pulmonary vascularity are normal. No adenopathy. No bone  lesions. No pneumothorax. IMPRESSION: Lungs clear.  Cardiac silhouette normal. Electronically Signed   By: Jun 21, 2015 III M.D.   On: 06/18/2020 13:15    Procedures Procedures   Medications Ordered in ED Medications - No data to display  ED Course  I have reviewed the triage vital signs and the  nursing notes.  Pertinent labs & imaging results that were available during my care of the patient were reviewed by me and considered in my medical decision making (see chart for details).  Clinical Course as of 06/19/20 1332  Mon Jun 18, 2020  1702 DG Chest 2 View [AH]    Clinical Course User Index [AH] Arthor Captain, PA-C   MDM Rules/Calculators/A&P                           Final Clinical Impression(s) / ED Diagnoses Final diagnoses:  None   55 year old female here with multiple complaints.  Her complaint of chest pain is extremely atypical for ACS.  I have extremely low suspicion for other emergent cause such as pulmonary embolus aortic dissection pneumothorax pneumonia.  Tingling pinching pain is very nondescript and also does not heighten my suspicions for emergent cause in any way.  Secondly, the patient's complaint of vibrating sensation and pains are also very nondescript.  I have considered peripheral neuropathy which is my working diagnosis at this time, but also considered things like Guillain-Barr syndrome however given the fact that she has no weakness and normal reflexes I also feel that this is highly irregular.  Other considerations are things like reflex sympathetic dystrophy although her symptoms are very broad.  I also have very low suspicion for stroke.  I suppose she could have MS however this does not require emergent work-up.  I did order and review labs which include CBC which shows no elevated white blood cell count, lipase is slightly elevated but without significant abnormality.  Patient also has no focal tenderness in the epigastrium or left upper quadrant.   Troponin is within normal limits x2, CMP shows elevated blood glucose without other significant abnormality.  I ordered and reviewed images of the right shoulder, chest, head and C-spine and all are without acute abnormality.  At this time I do not feel that patient's complaints are consistent with life limb or organ threatening or severe morbidity causing events.  Patient is referred back to the [place] [facility] and I have also given her a referral to neurology for assessment of her sensory abnormalities.  Rx / DC Orders ED Discharge Orders    None       Arthor Captain, PA-C 06/19/20 1336    Lorre Nick, MD 06/20/20 1536

## 2020-06-18 NOTE — ED Triage Notes (Signed)
Patient presents to the ED for evaluation for having a vibration feeling in legs and feet bilaterally.  Patient also states that the feeling radiates up to her arms and goes into her neck.

## 2020-06-18 NOTE — ED Notes (Signed)
Pt d/c by MD and is provided w/ d/c  Instructions and follow up care

## 2020-06-18 NOTE — Discharge Instructions (Addendum)
Get help right away if: You have an injury or infection that is not healing normally. You develop new weakness in an arm or leg. You have fallen or do so frequently. 

## 2020-06-25 ENCOUNTER — Encounter (HOSPITAL_COMMUNITY): Payer: Self-pay

## 2020-06-25 ENCOUNTER — Emergency Department (HOSPITAL_COMMUNITY)
Admission: EM | Admit: 2020-06-25 | Discharge: 2020-06-25 | Disposition: A | Discharge status: Home / Self Care | Payer: No Typology Code available for payment source | Attending: Emergency Medicine | Admitting: Emergency Medicine

## 2020-06-25 ENCOUNTER — Other Ambulatory Visit: Payer: Self-pay

## 2020-06-25 ENCOUNTER — Emergency Department (HOSPITAL_COMMUNITY): Payer: No Typology Code available for payment source

## 2020-06-25 DIAGNOSIS — J45909 Unspecified asthma, uncomplicated: Secondary | ICD-10-CM | POA: Diagnosis not present

## 2020-06-25 DIAGNOSIS — Z87891 Personal history of nicotine dependence: Secondary | ICD-10-CM | POA: Insufficient documentation

## 2020-06-25 DIAGNOSIS — R072 Precordial pain: Secondary | ICD-10-CM

## 2020-06-25 DIAGNOSIS — R0789 Other chest pain: Secondary | ICD-10-CM | POA: Insufficient documentation

## 2020-06-25 DIAGNOSIS — E119 Type 2 diabetes mellitus without complications: Secondary | ICD-10-CM | POA: Diagnosis not present

## 2020-06-25 DIAGNOSIS — Z7984 Long term (current) use of oral hypoglycemic drugs: Secondary | ICD-10-CM | POA: Diagnosis not present

## 2020-06-25 LAB — BASIC METABOLIC PANEL
Anion gap: 9 (ref 5–15)
BUN: 10 mg/dL (ref 6–20)
CO2: 24 mmol/L (ref 22–32)
Calcium: 8.9 mg/dL (ref 8.9–10.3)
Chloride: 105 mmol/L (ref 98–111)
Creatinine, Ser: 0.53 mg/dL (ref 0.44–1.00)
GFR, Estimated: >60 mL/min (ref >60)
Glucose, Bld: 174 mg/dL — ABNORMAL HIGH (ref 70–99)
Potassium: 3.8 mmol/L (ref 3.5–5.1)
Sodium: 138 mmol/L (ref 135–145)

## 2020-06-25 LAB — CBC
HCT: 41.4 % (ref 36.0–46.0)
Hemoglobin: 14 g/dL (ref 12.0–15.0)
MCH: 32.9 pg (ref 26.0–34.0)
MCHC: 33.8 g/dL (ref 30.0–36.0)
MCV: 97.4 fL (ref 80.0–100.0)
Platelets: 263 10*3/uL (ref 150–400)
RBC: 4.25 MIL/uL (ref 3.87–5.11)
RDW: 12.6 % (ref 11.5–15.5)
WBC: 7.8 10*3/uL (ref 4.0–10.5)
nRBC: 0 % (ref 0.0–0.2)

## 2020-06-25 LAB — TROPONIN I (HIGH SENSITIVITY): Troponin I (High Sensitivity): <2 ng/L (ref <18)

## 2020-06-25 MED ORDER — ACETAMINOPHEN 500 MG PO TABS
1000.0000 mg | ORAL_TABLET | Freq: Once | ORAL | Status: AC
  Administered 2020-06-25: 1000 mg via ORAL
  Filled 2020-06-25: qty 2

## 2020-06-25 MED ORDER — FAMOTIDINE 20 MG PO TABS
20.0000 mg | ORAL_TABLET | Freq: Once | ORAL | Status: AC
  Administered 2020-06-25: 20 mg via ORAL
  Filled 2020-06-25: qty 1

## 2020-06-25 MED ORDER — ALUM & MAG HYDROXIDE-SIMETH 200-200-20 MG/5ML PO SUSP
30.0000 mL | Freq: Once | ORAL | Status: AC
  Administered 2020-06-25: 30 mL via ORAL
  Filled 2020-06-25: qty 30

## 2020-06-25 NOTE — Discharge Instructions (Addendum)
It was our pleasure to provide your ER care today - we hope that you feel better.  If gi/reflux symptoms, try pepcid and maalox as need for symptom relief.   You may also take acetaminophen as need.  Although your ER tests are good, for further evaluation of your chest pain, follow up with cardiologist in the next 1-2 weeks - call office to arrange appointment.   Return to ER if worse, new symptoms, fevers, persistent/recurrent chest pain, increased trouble breathing, or other concern.

## 2020-06-25 NOTE — ED Triage Notes (Signed)
Pt here pov from home with cc right side chest pressure that started last week.

## 2020-06-25 NOTE — ED Notes (Signed)
Patient transported to X-ray 

## 2020-06-25 NOTE — ED Provider Notes (Signed)
[facility] EMERGENCY DEPARTMENT Provider Note   CSN: [id number] Arrival date & time: 06/25/20  0534     History Chief Complaint  Patient presents with  . Chest Pain    [name] [name] is a 55 y.o. female.  Patient c/o chest pain for the past 3-4 days. Pain acute onset, mid chest, pinching/sharp sensation, occurs at rest, not related to activity or exertion, not pleuritic. At times worse w lying down at night, states constant since last night. Denies radiation of pain. No associated sob, nv or diaphoresis. Denies chest wall injury or strain. ?indigestion. No leg pain or swelling. No recent surgery, immobility, trauma, or travel. No hx dvt or pe. No cough or uri symptoms. No fever or chills. No fam hx premature cad.   The history is provided by the patient.  Chest Pain Associated symptoms: no abdominal pain, no back pain, no cough, no fever, no headache, no palpitations, no shortness of breath and no vomiting        Past Medical History:  Diagnosis Date  . Allergy   . Diabetes mellitus without complication (HCC)   . Finger fracture, left   . GERD (gastroesophageal reflux disease)   . Hyperlipidemia   . Patellar fracture     Patient Active Problem List   Diagnosis Date Noted  . DIABETES MELLITUS 04/29/2007  . HYPERCHOLESTEROLEMIA 04/29/2007  . ASTHMA 04/29/2007  . ALLERGY 04/29/2007    Past Surgical History:  Procedure Laterality Date  . TONSILLECTOMY       OB History   No obstetric history on file.     Family History  Problem Relation Age of Onset  . Cancer Mother   . Diabetes Mother   . Cancer Father   . Diabetes Father   . Heart disease Father     Social History   Tobacco Use  . Smoking status: Former Games developer  . Smokeless tobacco: Never Used  . Tobacco comment: quit 1997  Substance Use Topics  . Alcohol use: No    Alcohol/week: 0.0 standard drinks  . Drug use: No    Home Medications Prior to Admission medications   Medication Sig Start Date  End Date Taking? Authorizing Provider  Cholecalciferol (VITAMIN D PO) Take 1 tablet by mouth daily. Reported on 08/08/2015    [provider]  Cyanocobalamin (VITAMIN B-12 PO) Take 1 tablet by mouth every other day. Reported on 08/08/2015    [provider]  dapagliflozin propanediol (FARXIGA) 5 MG TABS tablet Take 5 mg by mouth daily. Reported on 08/08/2015    [provider]  glipiZIDE (GLUCOTROL) 10 MG tablet Take 10 mg by mouth daily before breakfast.    [provider]  meloxicam (MOBIC) 7.5 MG tablet Take 2 tablets (15 mg total) by mouth daily. 06/18/20   Arthor Captain, PA-C  metFORMIN (GLUCOPHAGE) 1000 MG tablet Take 500 mg by mouth 2 (two) times daily with a meal. Reported on 08/08/2015    [provider]  omeprazole (PRILOSEC) 40 MG capsule Take 40 mg by mouth daily. Reported on 08/08/2015    [provider]  simvastatin (ZOCOR) 20 MG tablet Take 20 mg by mouth daily. Reported on 08/08/2015    [provider]    Allergies    Sulfa antibiotics  Review of Systems   Review of Systems  Constitutional: Negative for chills and fever.  HENT: Negative for sore throat.   Eyes: Negative for redness.  Respiratory: Negative for cough and shortness of breath.  Cardiovascular: Positive for chest pain. Negative for palpitations and leg swelling.  Gastrointestinal: Negative for abdominal pain and vomiting.  Genitourinary: Negative for flank pain.  Musculoskeletal: Negative for back pain and neck pain.  Skin: Negative for rash.  Neurological: Negative for headaches.  Hematological: Does not bruise/bleed easily.  Psychiatric/Behavioral: Negative for confusion.    Physical Exam Updated Vital Signs BP 134/80   Pulse 63   Temp 98.3 F (36.8 C)   Resp 17   LMP  (LMP Unknown)   SpO2 93%   Physical Exam Vitals and nursing note reviewed.  Constitutional:      Appearance: Normal appearance. She is well-developed.  HENT:     Head:  Atraumatic.     Nose: Nose normal.     Mouth/Throat:     Mouth: Mucous membranes are moist.  Eyes:     General: No scleral icterus.    Conjunctiva/sclera: Conjunctivae normal.  Neck:     Trachea: No tracheal deviation.  Cardiovascular:     Rate and Rhythm: Normal rate and regular rhythm.     Pulses: Normal pulses.     Heart sounds: Normal heart sounds. No murmur heard. No friction rub. No gallop.   Pulmonary:     Effort: Pulmonary effort is normal. No respiratory distress.     Breath sounds: Normal breath sounds.  Abdominal:     General: Bowel sounds are normal. There is no distension.     Palpations: Abdomen is soft.     Tenderness: There is no abdominal tenderness. There is no guarding.  Genitourinary:    Comments: No cva tenderness.  Musculoskeletal:        General: No swelling.     Cervical back: Normal range of motion and neck supple. No rigidity. No muscular tenderness.     Right lower leg: No edema.     Left lower leg: No edema.  Skin:    General: Skin is warm and dry.     Findings: No rash.  Neurological:     Mental Status: She is alert.     Comments: Alert, speech normal.   Psychiatric:        Mood and Affect: Mood normal.     ED Results / Procedures / Treatments   Labs (all labs ordered are listed, but only abnormal results are displayed) Results for orders placed or performed during the hospital encounter of 06/25/20  Basic metabolic panel  Result Value Ref Range   Sodium 138 135 - 145 mmol/L   Potassium 3.8 3.5 - 5.1 mmol/L   Chloride 105 98 - 111 mmol/L   CO2 24 22 - 32 mmol/L   Glucose, Bld 174 (H) 70 - 99 mg/dL   BUN 10 6 - 20 mg/dL   Creatinine, Ser 8.41 0.44 - 1.00 mg/dL   Calcium 8.9 8.9 - 66.0 mg/dL   GFR, Estimated >63 >01 mL/min   Anion gap 9 5 - 15  CBC  Result Value Ref Range   WBC 7.8 4.0 - 10.5 K/uL   RBC 4.25 3.87 - 5.11 MIL/uL   Hemoglobin 14.0 12.0 - 15.0 g/dL   HCT 60.1 09.3 - 23.5 %   MCV 97.4 80.0 - 100.0 fL   MCH 32.9 26.0  - 34.0 pg   MCHC 33.8 30.0 - 36.0 g/dL   RDW 57.3 22.0 - 25.4 %   Platelets 263 150 - 400 K/uL   nRBC 0.0 0.0 - 0.2 %  Troponin I (High Sensitivity)  Result Value Ref  Range   Troponin I (High Sensitivity) <2 <18 ng/L     EKG EKG Interpretation  Date/Time:  Monday Jun 25 2020 06:06:02 EDT Ventricular Rate:  68 PR Interval:  174 QRS Duration: 95 QT Interval:  407 QTC Calculation: 433 R Axis:   16 Text Interpretation: Sinus rhythm Low voltage, precordial leads Baseline wander in lead(s) I V2 V5 No significant change since 06/19/2015 Confirmed by Geoffery Lyons ([id number]) on 06/25/2020 6:10:45 AM   Radiology DG Chest 2 View  Result Date: 06/25/2020 CLINICAL DATA:  Chest pain. EXAM: CHEST - 2 VIEW COMPARISON:  Chest x-ray 06/18/20 FINDINGS: The heart size and mediastinal contours are unchanged. No focal consolidation. No pulmonary edema. No pleural effusion. No pneumothorax. No acute osseous abnormality. IMPRESSION: No active cardiopulmonary disease. Electronically Signed   By: Tish Frederickson M.D.   On: 06/25/2020 06:50    Procedures Procedures   Medications Ordered in ED Medications  acetaminophen (TYLENOL) tablet 1,000 mg (1,000 mg Oral Given 06/25/20 0751)  famotidine (PEPCID) tablet 20 mg (20 mg Oral Given 06/25/20 0751)  alum & mag hydroxide-simeth (MAALOX/MYLANTA) 200-200-20 MG/5ML suspension 30 mL (30 mLs Oral Given 06/25/20 0751)    ED Course  I have reviewed the triage vital signs and the nursing notes.  Pertinent labs & imaging results that were available during my care of the patient were reviewed by me and considered in my medical decision making (see chart for details).    MDM Rules/Calculators/A&P                          Iv ns. Continuous pulse ox and cardiac monitoring. Stat labs. Cxr. Ecg.  Reviewed nursing notes and prior charts for additional history.   Labs reviewed/interpreted by me - trop normal. After constant symptoms for prolonged period, felt not c/w ACS.    CXR reviewed/interpreted by me - no pna.   Acetaminophen po, pepcid po, maalox po, for symptom relief.   Patient currently appears stable for d/c.   Rec pcp/cardiology f/u.  Return precautions provided.     Final Clinical Impression(s) / ED Diagnoses Final diagnoses:  None    Rx / DC Orders ED Discharge Orders    None       Cathren Laine, MD 06/25/20 [phone]

## 2020-06-25 NOTE — ED Notes (Signed)
Report received from [place], [place]. Patient voices no complaints at this time.

## 2021-04-30 ENCOUNTER — Emergency Department: Admit: 2021-04-30 | Payer: BLUE CROSS/BLUE SHIELD | Primary: Family Medicine

## 2021-04-30 ENCOUNTER — Inpatient Hospital Stay
Admit: 2021-04-30 | Discharge: 2021-05-02 | Disposition: A | Discharge status: Home / Self Care | Payer: BLUE CROSS/BLUE SHIELD | Admitting: Internal Medicine

## 2021-04-30 DIAGNOSIS — I25119 Atherosclerotic heart disease of native coronary artery with unspecified angina pectoris: Secondary | ICD-10-CM

## 2021-04-30 DIAGNOSIS — I209 Angina pectoris, unspecified: Secondary | ICD-10-CM

## 2021-04-30 LAB — COMPREHENSIVE METABOLIC PANEL W/ REFLEX TO MG FOR LOW K
ALT: 15 U/L (ref 10–40)
AST: 17 U/L (ref 15–37)
Albumin/Globulin Ratio: 1.6 (ref 1.1–2.2)
Albumin: 4.3 g/dL (ref 3.4–5.0)
Alkaline Phosphatase: 96 U/L (ref 40–129)
Anion Gap: 10 (ref 3–16)
BUN: 13 mg/dL (ref 7–20)
CO2: 27 mmol/L (ref 21–32)
Calcium: 9.2 mg/dL (ref 8.3–10.6)
Chloride: 107 mmol/L (ref 99–110)
Creatinine: 0.8 mg/dL (ref 0.6–1.1)
Est, Glom Filt Rate: >60 (ref >60)
Glucose: 117 mg/dL — ABNORMAL HIGH (ref 70–99)
Potassium reflex Magnesium: 3.5 mmol/L (ref 3.5–5.1)
Sodium: 144 mmol/L (ref 136–145)
Total Bilirubin: 0.5 mg/dL (ref 0.0–1.0)
Total Protein: 7 g/dL (ref 6.4–8.2)

## 2021-04-30 LAB — CBC WITH AUTO DIFFERENTIAL
Basophils %: 1.2 %
Basophils Absolute: 0.1 10*3/uL (ref 0.0–0.2)
Eosinophils %: 3.9 %
Eosinophils Absolute: 0.3 10*3/uL (ref 0.0–0.6)
Hematocrit: 39.7 % (ref 36.0–48.0)
Hemoglobin: 12.9 g/dL (ref 12.0–16.0)
Lymphocytes %: 26.3 %
Lymphocytes Absolute: 2.2 10*3/uL (ref 1.0–5.1)
MCH: 31.7 pg (ref 26.0–34.0)
MCHC: 32.4 g/dL (ref 31.0–36.0)
MCV: 97.7 fL (ref 80.0–100.0)
MPV: 7.8 fL (ref 5.0–10.5)
Monocytes %: 6.2 %
Monocytes Absolute: 0.5 10*3/uL (ref 0.0–1.3)
Neutrophils %: 62.4 %
Neutrophils Absolute: 5.2 10*3/uL (ref 1.7–7.7)
Platelets: 244 10*3/uL (ref 135–450)
RBC: 4.07 M/uL (ref 4.00–5.20)
RDW: 14.3 % (ref 12.4–15.4)
WBC: 8.3 10*3/uL (ref 4.0–11.0)

## 2021-04-30 LAB — LIPASE: Lipase: 49 U/L (ref 13.0–60.0)

## 2021-04-30 LAB — MAGNESIUM: Magnesium: 2.3 mg/dL (ref 1.80–2.40)

## 2021-04-30 LAB — TROPONIN: Troponin: <0.01 ng/mL (ref <0.01)

## 2021-04-30 LAB — BRAIN NATRIURETIC PEPTIDE: Pro-BNP: 105 pg/mL (ref 0–124)

## 2021-04-30 LAB — D-DIMER, QUANTITATIVE: D-Dimer, Quant: 0.46 ug/mL FEU (ref 0.00–0.60)

## 2021-04-30 MED ORDER — PANTOPRAZOLE SODIUM 40 MG PO TBEC
40 MG | Freq: Two times a day (BID) | ORAL | Status: DC
Start: 2021-04-30 — End: 2021-05-02
  Administered 2021-05-01 – 2021-05-02 (×5): 40 mg via ORAL

## 2021-04-30 MED ORDER — ACETAMINOPHEN 650 MG RE SUPP
650 MG | Freq: Four times a day (QID) | RECTAL | Status: DC | PRN
Start: 2021-04-30 — End: 2021-05-02

## 2021-04-30 MED ORDER — POLYETHYLENE GLYCOL 3350 17 G PO PACK
17 g | Freq: Every day | ORAL | Status: DC | PRN
Start: 2021-04-30 — End: 2021-05-02

## 2021-04-30 MED ORDER — SODIUM CHLORIDE 0.9 % IV SOLN
0.9 % | INTRAVENOUS | Status: DC | PRN
Start: 2021-04-30 — End: 2021-05-02

## 2021-04-30 MED ORDER — ONDANSETRON 4 MG PO TBDP
4 MG | Freq: Three times a day (TID) | ORAL | Status: DC | PRN
Start: 2021-04-30 — End: 2021-05-02

## 2021-04-30 MED ORDER — ATORVASTATIN CALCIUM 40 MG PO TABS
40 MG | Freq: Every evening | ORAL | Status: DC
Start: 2021-04-30 — End: 2021-05-02
  Administered 2021-05-01 – 2021-05-02 (×2): 40 mg via ORAL

## 2021-04-30 MED ORDER — NORMAL SALINE FLUSH 0.9 % IV SOLN
0.9 % | INTRAVENOUS | Status: DC | PRN
Start: 2021-04-30 — End: 2021-05-02

## 2021-04-30 MED ORDER — MORPHINE SULFATE (PF) 2 MG/ML IV SOLN
2 MG/ML | INTRAVENOUS | Status: DC | PRN
Start: 2021-04-30 — End: 2021-05-02

## 2021-04-30 MED ORDER — NITROGLYCERIN 0.4 MG SL SUBL
0.4 MG | SUBLINGUAL | Status: DC | PRN
Start: 2021-04-30 — End: 2021-05-02
  Administered 2021-05-01: 21:00:00 0.4 mg via SUBLINGUAL

## 2021-04-30 MED ORDER — LISINOPRIL 20 MG PO TABS
20 MG | Freq: Every day | ORAL | Status: DC
Start: 2021-04-30 — End: 2021-05-02
  Administered 2021-05-01 – 2021-05-02 (×2): 40 mg via ORAL

## 2021-04-30 MED ORDER — ASPIRIN 81 MG PO CHEW
81 MG | Freq: Once | ORAL | Status: AC
Start: 2021-04-30 — End: 2021-04-30
  Administered 2021-04-30: 20:00:00 324 mg via ORAL

## 2021-04-30 MED ORDER — LORAZEPAM 2 MG/ML IJ SOLN
2 MG/ML | Freq: Four times a day (QID) | INTRAMUSCULAR | Status: DC | PRN
Start: 2021-04-30 — End: 2021-05-02
  Administered 2021-05-02: 02:00:00 1 mg via INTRAVENOUS

## 2021-04-30 MED ORDER — SODIUM CHLORIDE 0.9 % IV SOLN
0.9 % | INTRAVENOUS | Status: DC
Start: 2021-04-30 — End: 2021-05-02
  Administered 2021-05-01 – 2021-05-02 (×5): via INTRAVENOUS

## 2021-04-30 MED ORDER — NORMAL SALINE FLUSH 0.9 % IV SOLN
0.9 % | Freq: Two times a day (BID) | INTRAVENOUS | Status: DC
Start: 2021-04-30 — End: 2021-05-02
  Administered 2021-05-01 – 2021-05-02 (×3): 10 mL via INTRAVENOUS

## 2021-04-30 MED ORDER — ONDANSETRON HCL 4 MG/2ML IJ SOLN
4 MG/2ML | Freq: Four times a day (QID) | INTRAMUSCULAR | Status: DC | PRN
Start: 2021-04-30 — End: 2021-05-02

## 2021-04-30 MED ORDER — ASPIRIN 81 MG PO TBEC
81 MG | Freq: Every day | ORAL | Status: DC
Start: 2021-04-30 — End: 2021-05-02
  Administered 2021-05-02: 81 mg via ORAL

## 2021-04-30 MED ORDER — NICOTINE 14 MG/24HR TD PT24
14 MG/24HR | Freq: Every day | TRANSDERMAL | Status: DC
Start: 2021-04-30 — End: 2021-05-02

## 2021-04-30 MED ORDER — ENOXAPARIN SODIUM 40 MG/0.4ML IJ SOSY
40 MG/0.4ML | Freq: Every day | INTRAMUSCULAR | Status: DC
Start: 2021-04-30 — End: 2021-05-02

## 2021-04-30 MED ORDER — NITROGLYCERIN 2 % TD OINT
2 % | Freq: Once | TRANSDERMAL | Status: AC
Start: 2021-04-30 — End: 2021-04-30
  Administered 2021-04-30: 20:00:00 1 [in_us] via TOPICAL

## 2021-04-30 MED ORDER — ACETAMINOPHEN 325 MG PO TABS
325 MG | Freq: Four times a day (QID) | ORAL | Status: DC | PRN
Start: 2021-04-30 — End: 2021-05-02

## 2021-04-30 MED ORDER — SUCRALFATE 1 G PO TABS
1 GM | Freq: Four times a day (QID) | ORAL | Status: DC
Start: 2021-04-30 — End: 2021-05-02
  Administered 2021-05-01 – 2021-05-02 (×7): 1 g via ORAL

## 2021-04-30 MED FILL — NITRO-BID 2 % TD OINT: 2 % | TRANSDERMAL | Qty: 1

## 2021-04-30 MED FILL — ASPIRIN LOW STRENGTH 81 MG PO CHEW: 81 MG | ORAL | Qty: 4

## 2021-04-30 NOTE — Progress Notes (Signed)
Pt admitted from ER to Room 5904. Pt has all belongings verified by pt from Emergency Room. Telemetry monitor on pt, SR on the monitor. Bed wheels locked and bed in lowest position. Educated pt on floor, room number, call light, fall risk, diet, POC, medications, and to use call light if ever in need of assistance or if they have any questions. Admission completed at this time.Encouraged to use call light before getting out of bed. Pt verbalized understanding and has no more questions at this time.

## 2021-04-30 NOTE — H&P (Signed)
HOSPITALISTS HISTORY AND PHYSICAL    04/30/2021 5:14 PM    Patient Information:  Yvonne King is a 56 y.o. female [id number]  PCP:  Severiano Gilbert, DO (Tel: [phone] )    Chief complaint:    Chief Complaint   Patient presents with    Chest Pain     Pt brought in per [facility] with sudden onset of midsternal CP with radiation into jaw, pt at first thought she had a food bolus.  Pt was able to swallow. Slight headache, left breast pain as well.         History of Present Illness:  Yvonne King is a 56 y.o. female with history of tobacco abuse, HTN, GERD came to ER with complaints of chest pain.  Says midsternal chest pain that is sharp and radiating to both sides of neck and jaw.  Associated with SOB.  Lasts for seconds.  Was on Doxycycline for sinus infection and had some trouble swallowing.  No fevers, chills or NS.  No melena, hematochezia, nausea or vomiting.  No abdominal pain.  No LE edema or orthopnea.  Smokes 1/2 ppd.  Occasional EtOH.  No palpitations or syncope.  Otherwise complete ROS is negative unless listed above.    REVIEW OF SYSTEMS:   Pertinent positives as noted in HPI.  All other systems were reviewed and are negative.    Past Medical History:   has a past medical history of Endometriosis, GERD (gastroesophageal reflux disease), HTN (hypertension), and Migraine.     Past Surgical History:   has a past surgical history that includes Hysterectomy; Cesarean section; Appendectomy; pelvic laparoscopy; cyst removal (Right, 07/29/12); and Wrist ganglion excision (07/29/12).     Medications:  No current facility-administered medications on file prior to encounter.     Current Outpatient Medications on File Prior to Encounter   Medication Sig Dispense Refill    atorvastatin (LIPITOR) 20 MG tablet Take by mouth daily      pantoprazole (PROTONIX) 40 MG tablet Take by mouth 2 times daily       vitamin D (CHOLECALCIFEROL) 50000 UNIT CAPS Take 1 capsule by mouth once WEEKLY for 6 months, then continue OTC VitD3 2000IU once daily      coenzyme Q10 100 MG CAPS capsule Take by mouth daily      hydrochlorothiazide (HYDRODIURIL) 25 MG tablet TAKE ONE TABLET BY MOUTH DAILY (Patient not taking: Reported on 04/30/2021) 30 tablet 0    lisinopril (PRINIVIL;ZESTRIL) 10 MG tablet TAKE ONE TABLET BY MOUTH DAILY (Patient taking differently: Take 40 mg by mouth daily) 30 tablet 2    aspirin 81 MG tablet Take 81 mg by mouth daily      cyclobenzaprine (FLEXERIL) 10 MG TABS Take 10 mg by mouth every 8 hours as needed (Patient not taking: Reported on 04/30/2021) 90 tablet 3    omeprazole (PRILOSEC) 20 MG capsule Take 20 mg by mouth daily. (Patient not taking: Reported on 04/30/2021)         Allergies:  Allergies   Allergen Reactions    Imitrex [Sumatriptan] Other (See Comments)     Bradycardia     Sulfa Antibiotics Hives        Social History:  Patient Lives with husband   reports that she has been smoking. She has been smoking an average of 1 pack per day. She does not have any smokeless tobacco history on file. She reports current alcohol use.     Family History:  family history includes Cancer in her sister; Heart Failure in her mother; Hypertension in her father.     Physical Exam:  BP 128/74    Pulse 77    Temp 98.5 ??F (36.9 ??C) (Oral)    Resp 13    Ht 5' 4.5" (1.638 m)    Wt 150 lb (68 kg)    LMP 03/17/2008    SpO2 99%    BMI 25.35 kg/m??     General appearance:  Appears comfortable. Well nourished  Eyes: Sclera clear, pupils equal  ENT: Moist mucus membranes, no thrush. Trachea midline.  Cardiovascular: Regular rhythm, normal S1, S2. No murmur, gallop, rub. No edema in lower extremities  Respiratory: Clear to auscultation bilaterally, no wheeze, good inspiratory effort  Gastrointestinal: Abdomen soft, non-tender, not distended, normal bowel sounds  Musculoskeletal: No cyanosis in digits, neck supple  Neurology: Cranial  nerves grossly intact. Alert and oriented in time, place and person. No speech or motor deficits  Psychiatry: Appropriate affect. Not agitated  Skin: Warm, dry, normal turgor, no rash  Brisk capillary refill, peripheral pulses palpable   Labs:  CBC:   Lab Results   Component Value Date/Time    WBC 8.3 04/30/2021 03:49 PM    RBC 4.07 04/30/2021 03:49 PM    HGB 12.9 04/30/2021 03:49 PM    HCT 39.7 04/30/2021 03:49 PM    MCV 97.7 04/30/2021 03:49 PM    MCH 31.7 04/30/2021 03:49 PM    MCHC 32.4 04/30/2021 03:49 PM    RDW 14.3 04/30/2021 03:49 PM    PLT 244 04/30/2021 03:49 PM    MPV 7.8 04/30/2021 03:49 PM     BMP:    Lab Results   Component Value Date/Time    NA 144 04/30/2021 03:49 PM    K 3.5 04/30/2021 03:49 PM    CL 107 04/30/2021 03:49 PM    CO2 27 04/30/2021 03:49 PM    BUN 13 04/30/2021 03:49 PM    CREATININE 0.8 04/30/2021 03:49 PM    CALCIUM 9.2 04/30/2021 03:49 PM    GFRAA >60 07/16/2012 09:20 AM    LABGLOM >60 04/30/2021 03:49 PM    GLUCOSE 117 04/30/2021 03:49 PM     XR CHEST PORTABLE   Final Result   No acute cardiopulmonary findings               Problem List  Principal Problem:    Chest pain  Active Problems:    Tobacco abuse disorder    Essential hypertension  Resolved Problems:    * No resolved hospital problems. *        Assessment/Plan:   Chest pain  Place in observation  Telemetry to r/o arrhythmia  Check serial troponin  Continue ASA and Lipitor  NTG PRN  Morphine IV PRN severe pain  SPECT in AM, NPO p MN  GERD  Start Protonix bid  Resume Carafate  Use Maalox PRN  3. Tobacco abuse disorder   - Counseled on cessation   - Start nicotine patch  4. HTN   - Continue Lisinopril      DVT prophylaxis Lovenox  Code status Full code  Diet General, NPO p MN  IV access Peripheral   Foley Catheter No    Observation. I anticipate hospitalization spanning less than two midnights for investigation and treatment of the above medically necessary diagnoses.    Discussed with patient and husband.    Lance Bosch, MD     04/30/2021 5:14 PM

## 2021-04-30 NOTE — ED Notes (Signed)
Bedside report to Otho Darner Hymie Gorr, RN  04/30/21 [phone]

## 2021-04-30 NOTE — ED Provider Notes (Signed)
[facility] - [facility]y [facility], Dalbert Batman, MD, am the primary clinician of record.    CHIEF COMPLAINT  Chief Complaint   Patient presents with    Chest Pain     Pt brought in per [facility] with sudden onset of midsternal CP with radiation into jaw, pt at first thought she had a food bolus.  Pt was able to swallow. Slight headache, left breast pain as well.           HISTORY OF PRESENT ILLNESS  Yvonne King is a 56 y.o. female  who presents to the ED complaining of cute onset about an hour prior to arrival of substernal and midsternal chest pressure and squeezing-like sensation that radiates into the jaw and feels like a heaviness.  She has had some tingling and numbness in the jaw bilaterally as well as with it.  She was eating [place] fries at the time but has a history of reflux and this does not feel like her reflux.  She also has a history of hyperlipidemia hypertension and a family history of CAD as well as cigarette smoking.  She denies any abdominal pains vomiting or diarrhea.  She has a mild headache and lightheadedness associated with it as well.  Denies any leg swelling or history of DVT or PE.  She does not feel short of breath or have any pleurisy.  Nothing exacerbates or alleviates the symptoms.  She came in by EMS and received only IV fluids prehospital.      No other complaints, modifying factors or associated symptoms.     I have reviewed the following from the nursing documentation.    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 one ovary    PELVIC LAPAROSCOPY      WRIST GANGLION EXCISION  07/29/12    right wrist     Family History   Problem Relation Age of  Onset    Cancer Sister         breast    Heart Failure Mother         mi     Hypertension Father      Social History     Socioeconomic History    Marital status: Married     Spouse name: Not on file    Number of children: Not on file    Years of education: Not on file    Highest education level: Not on file   Occupational History    Not on file   Tobacco Use    Smoking status: Every Day     Packs/day: 1.00     Types: Cigarettes    Smokeless tobacco: Not on file   Substance and Sexual Activity    Alcohol use: Yes     Comment: social    Drug use: Not on file    Sexual activity: Not on file   Other Topics Concern    Not on file   Social History Narrative    Not on file  Social Determinants of Health     Financial Resource Strain: Not on file   Food Insecurity: Not on file   Transportation Needs: Not on file   Physical Activity: Not on file   Stress: Not on file   Social Connections: Not on file   Intimate Partner Violence: Not on file   Housing Stability: Not on file     No current facility-administered medications for this encounter.     Current Outpatient Medications   Medication Sig Dispense Refill    atorvastatin (LIPITOR) 20 MG tablet Take by mouth daily      pantoprazole (PROTONIX) 40 MG tablet Take by mouth 2 times daily      vitamin D (CHOLECALCIFEROL) 50000 UNIT CAPS Take 1 capsule by mouth once WEEKLY for 6 months, then continue OTC VitD3 2000IU once daily      coenzyme Q10 100 MG CAPS capsule Take by mouth daily      hydrochlorothiazide (HYDRODIURIL) 25 MG tablet TAKE ONE TABLET BY MOUTH DAILY (Patient not taking: Reported on 04/30/2021) 30 tablet 0    lisinopril (PRINIVIL;ZESTRIL) 10 MG tablet TAKE ONE TABLET BY MOUTH DAILY (Patient taking differently: Take 40 mg by mouth daily) 30 tablet 2    aspirin 81 MG tablet Take 81 mg by mouth daily      cyclobenzaprine (FLEXERIL) 10 MG TABS Take 10 mg by mouth every 8 hours as needed (Patient not taking: Reported on 04/30/2021) 90 tablet 3    omeprazole (PRILOSEC)  20 MG capsule Take 20 mg by mouth daily. (Patient not taking: Reported on 04/30/2021)       Allergies   Allergen Reactions    Imitrex [Sumatriptan] Other (See Comments)     Bradycardia     Sulfa Antibiotics Hives       REVIEW OF SYSTEMS  10 systems reviewed, pertinent positives per HPI otherwise noted to be negative.    PHYSICAL EXAM  BP 133/71    Pulse 89    Temp 98.5 ??F (36.9 ??C) (Oral)    Resp 12    Ht 5' 4.5" (1.638 m)    Wt 150 lb (68 kg)    LMP 03/17/2008    SpO2 100%    BMI 25.35 kg/m??    GENERAL APPEARANCE: Awake and alert. Cooperative. No distress.  HENT: Normocephalic. Atraumatic. Mucous membranes are moist.  NECK: Supple.    EYES: PERRL. EOM's grossly intact.  HEART/CHEST: RRR. No murmurs.  No chest wall tenderness.  LUNGS: Respirations unlabored. CTAB. Good air exchange. Speaking comfortably in full sentences.   ABDOMEN: No tenderness. Soft. Non-distended. No masses. No organomegaly. No guarding or rebound. Normal bowel sounds throughout.  MUSCULOSKELETAL: No extremity edema. Compartments soft.  No deformity.  No tenderness in the extremities.  All extremities neurovascularly intact.  SKIN: Warm and dry. No acute rashes.   NEUROLOGICAL: Alert and oriented. CN's 2-12 intact. No gross facial drooping. Strength 5/5, sensation intact. 2 plus DTR's in knees bilaterally.  Gait normal.  PSYCHIATRIC: Normal mood and affect.    LABS  I have personally reviewed all labs for this visit.   Results for orders placed or performed during the hospital encounter of 04/30/21   CBC with Auto Differential   Result Value Ref Range    WBC 8.3 4.0 - 11.0 K/uL    RBC 4.07 4.00 - 5.20 M/uL    Hemoglobin 12.9 12.0 - 16.0 g/dL    Hematocrit 16.139.7 09.636.0 - 48.0 %    MCV 97.7 80.0 -  100.0 fL    MCH 31.7 26.0 - 34.0 pg    MCHC 32.4 31.0 - 36.0 g/dL    RDW 81.1 91.4 - 78.2 %    Platelets 244 135 - 450 K/uL    MPV 7.8 5.0 - 10.5 fL    Neutrophils % 62.4 %    Lymphocytes % 26.3 %    Monocytes % 6.2 %    Eosinophils % 3.9 %    Basophils %  1.2 %    Neutrophils Absolute 5.2 1.7 - 7.7 K/uL    Lymphocytes Absolute 2.2 1.0 - 5.1 K/uL    Monocytes Absolute 0.5 0.0 - 1.3 K/uL    Eosinophils Absolute 0.3 0.0 - 0.6 K/uL    Basophils Absolute 0.1 0.0 - 0.2 K/uL   Comprehensive Metabolic Panel w/ Reflex to MG   Result Value Ref Range    Sodium 144 136 - 145 mmol/L    Potassium reflex Magnesium 3.5 3.5 - 5.1 mmol/L    Chloride 107 99 - 110 mmol/L    CO2 27 21 - 32 mmol/L    Anion Gap 10 3 - 16    Glucose 117 (H) 70 - 99 mg/dL    BUN 13 7 - 20 mg/dL    Creatinine 0.8 0.6 - 1.1 mg/dL    Est, Glom Filt Rate >60 >60    Calcium 9.2 8.3 - 10.6 mg/dL    Total Protein 7.0 6.4 - 8.2 g/dL    Albumin 4.3 3.4 - 5.0 g/dL    Albumin/Globulin Ratio 1.6 1.1 - 2.2    Total Bilirubin 0.5 0.0 - 1.0 mg/dL    Alkaline Phosphatase 96 40 - 129 U/L    ALT 15 10 - 40 U/L    AST 17 15 - 37 U/L   Troponin   Result Value Ref Range    Troponin <0.01 <0.01 ng/mL   Brain Natriuretic Peptide   Result Value Ref Range    Pro-BNP 105 0 - 124 pg/mL   Lipase   Result Value Ref Range    Lipase 49.0 13.0 - 60.0 U/L   D-Dimer, Quantitative   Result Value Ref Range    D-Dimer, Quant 0.46 0.00 - 0.60 ug/mL FEU   EKG 12 Lead   Result Value Ref Range    Ventricular Rate 77 BPM    Atrial Rate 77 BPM    P-R Interval 146 ms    QRS Duration 90 ms    Q-T Interval 414 ms    QTc Calculation (Bazett) 468 ms    P Axis 30 degrees    R Axis 50 degrees    T Axis 33 degrees    Diagnosis Normal sinus rhythmNormal ECG        EKG performed in ED:  The 12 lead EKG was interpreted by me independent of a cardiologist as follows:  Rate: normal with a rate of 77  Rhythm: sinus  Axis: normal  Intervals: normal PR, narrow QRS, normal QTc  ST segments: no ST elevations or depressions  T waves: no abnormal inversions  Non-specific T wave changes: not present  Prior EKG comparison: No prior is currently available for comparison    RADIOLOGY  Any applicable radiology studies including x-ray, CT, MRI, and/or ultrasound, were  reviewed independently by me in addition to the radiologist.  I reviewed all radiology images and reports as well from this evaluation.    XR CHEST PORTABLE    Result Date: 04/30/2021  No acute cardiopulmonary  findings        ED COURSE/MDM  Patient seen and evaluated. Old records reviewed. Labs and imaging reviewed.    After initial evaluation, differential diagnostic considerations included but not limited to: acute coronary syndrome, pulmonary embolism, COPD/asthma, pneumonia, musculoskeletal, reflux/PUD/gastritis, pneumothorax, CHF, thoracic aortic dissection, anxiety    The patient's ED workup was notable for central chest discomfort, worry is for anginal etiology until proven otherwise.  Chest x-ray is clear, dissection considered but history is atypical with 2+ radial pulses bilaterally and negative D-dimer and clear chest x-ray without wide mediastinum.  X-ray also rules out pneumothorax or infectious process and symptoms are also not consistent with infection.  PE was considered although the patient is low risk without short of breath tachycardia tachypnea or hypoxia and a D-dimer is negative to definitively rule this out.  EKG is unremarkable.  Labs notable for neg trop and unremarkable cbc/chemistry.  Heart score 5.  For this reason the patient will be admitted to the hospital for an ACS rule out evaluation.  She was given aspirin and nitroglycerin paste.  On reassessment, she is feeling a little better.  Reflux is also considered however she says that this is atypical for her reflux and again based on heart score must rule out ACS prior to presuming GI etiology.    Is this patient to be included in the SEP-1 Core Measure?  No   Exclusion criteria - the patient is NOT to be included for SEP-1 Core Measure due to:  Infection is not suspected    HEART SCORE:    History: 2  ECG: 0  Patient Age: 22  *Risk factors for Atherosclerotic disease: Cigarette smoking; Hypercholesterolemia; Hypertension; Positive family  History  Risk Factors: 2  Troponin: 0  Heart Score Total: 5      Heart score: 5.  This falls under the following category: Score of 4-6, which indicates low/moderate risk for major adverse cardiac event and supports observation with repeated troponins and/or non-invasive testing    Consults:  None    History obtained from: Patient    Pertinent social determinants of health: None applicable    Chronic conditions potentially affecting care:  HTN HLD smoker    Review of other records:  None    Reassessment:  See MDM for details of medications given and reassessment    During the patient's ED course, the patient was given:  Medications   aspirin chewable tablet 324 mg (324 mg Oral Given 04/30/21 1604)   nitroglycerin (NITRO-BID) 2 % ointment 1 inch (1 inch Topical Given 04/30/21 1605)        CLINICAL IMPRESSION  1. Angina pectoris (HCC)        Blood pressure 133/71, pulse 89, temperature 98.5 ??F (36.9 ??C), temperature source Oral, resp. rate 12, height 5' 4.5" (1.638 m), weight 150 lb (68 kg), last menstrual period 03/17/2008, SpO2 100 %.    DISPOSITION  Yvonne King was admitted in fair condition.    The plan is to admit to the hospital at this time under the hospitalist service.  Hospitalist accepted the patient and will take over the patient's care.    Critical care time:  None    DISCLAIMER: This chart was created using Scientist, clinical (histocompatibility and immunogenetics).  Efforts were made by me to ensure accuracy, however some errors may be present due to limitations of this technology and occasionally words are not transcribed correctly.        Dalbert Batman, MD  04/30/21 1633

## 2021-05-01 ENCOUNTER — Observation Stay: Admit: 2021-05-01 | Payer: BLUE CROSS/BLUE SHIELD | Primary: Family Medicine

## 2021-05-01 LAB — BASIC METABOLIC PANEL W/ REFLEX TO MG FOR LOW K
Anion Gap: 8 (ref 3–16)
BUN: 13 mg/dL (ref 7–20)
CO2: 25 mmol/L (ref 21–32)
Calcium: 8.9 mg/dL (ref 8.3–10.6)
Chloride: 110 mmol/L (ref 99–110)
Creatinine: 0.9 mg/dL (ref 0.6–1.1)
Est, Glom Filt Rate: >60 (ref >60)
Glucose: 89 mg/dL (ref 70–99)
Potassium reflex Magnesium: 3.9 mmol/L (ref 3.5–5.1)
Sodium: 143 mmol/L (ref 136–145)

## 2021-05-01 LAB — CBC WITH AUTO DIFFERENTIAL
Basophils %: 0.9 %
Basophils Absolute: 0.1 10*3/uL (ref 0.0–0.2)
Eosinophils %: 4.9 %
Eosinophils Absolute: 0.3 10*3/uL (ref 0.0–0.6)
Hematocrit: 35.4 % — ABNORMAL LOW (ref 36.0–48.0)
Hemoglobin: 11.6 g/dL — ABNORMAL LOW (ref 12.0–16.0)
Lymphocytes %: 35 %
Lymphocytes Absolute: 2.4 10*3/uL (ref 1.0–5.1)
MCH: 31.9 pg (ref 26.0–34.0)
MCHC: 32.7 g/dL (ref 31.0–36.0)
MCV: 97.6 fL (ref 80.0–100.0)
MPV: 7.4 fL (ref 5.0–10.5)
Monocytes %: 7.3 %
Monocytes Absolute: 0.5 10*3/uL (ref 0.0–1.3)
Neutrophils %: 51.9 %
Neutrophils Absolute: 3.6 10*3/uL (ref 1.7–7.7)
Platelets: 202 10*3/uL (ref 135–450)
RBC: 3.62 M/uL — ABNORMAL LOW (ref 4.00–5.20)
RDW: 13.5 % (ref 12.4–15.4)
WBC: 6.9 10*3/uL (ref 4.0–11.0)

## 2021-05-01 LAB — LIPID PANEL
Cholesterol, Total: 127 mg/dL (ref 0–199)
HDL: 44 mg/dL (ref 40–60)
LDL Calculated: 71 mg/dL (ref <100)
Triglycerides: 62 mg/dL (ref 0–150)
VLDL Cholesterol Calculated: 12 mg/dL

## 2021-05-01 LAB — EKG 12-LEAD
Atrial Rate: 77 {beats}/min
P Axis: 30 degrees
P-R Interval: 146 ms
Q-T Interval: 414 ms
QRS Duration: 90 ms
QTc Calculation (Bazett): 468 ms
R Axis: 50 degrees
T Axis: 33 degrees
Ventricular Rate: 77 {beats}/min

## 2021-05-01 LAB — NM MYOCARDIAL SPECT REST EXERCISE OR RX: Left Ventricular Ejection Fraction: 74

## 2021-05-01 LAB — TROPONIN
Troponin: <0.01 ng/mL (ref <0.01)
Troponin: <0.01 ng/mL (ref <0.01)
Troponin: <0.01 ng/mL (ref <0.01)

## 2021-05-01 MED ORDER — NORMAL SALINE FLUSH 0.9 % IV SOLN
0.9 % | INTRAVENOUS | Status: AC
Start: 2021-05-01 — End: ?

## 2021-05-01 MED ORDER — IOPAMIDOL 76 % IV SOLN
76 % | Freq: Once | INTRAVENOUS | Status: AC | PRN
Start: 2021-05-01 — End: 2021-05-01
  Administered 2021-05-01: 21:00:00 120 mL via INTRAVENOUS

## 2021-05-01 MED ORDER — AMLODIPINE BESYLATE 5 MG PO TABS
5 MG | Freq: Every evening | ORAL | Status: DC
Start: 2021-05-01 — End: 2021-05-02
  Administered 2021-05-01 – 2021-05-02 (×2): 5 mg via ORAL

## 2021-05-01 MED ORDER — TECHNETIUM TC 99M TETROFOSMIN IV KIT
Freq: Once | INTRAVENOUS | Status: AC | PRN
Start: 2021-05-01 — End: 2021-05-01
  Administered 2021-05-01: 14:00:00 30 via INTRAVENOUS

## 2021-05-01 MED ORDER — SODIUM CHLORIDE 0.9 % IV SOLN
0.9 % | INTRAVENOUS | Status: AC
Start: 2021-05-01 — End: ?

## 2021-05-01 MED ORDER — TECHNETIUM TC 99M TETROFOSMIN IV KIT
Freq: Once | INTRAVENOUS | Status: AC | PRN
Start: 2021-05-01 — End: 2021-05-01
  Administered 2021-05-01: 12:00:00 10 via INTRAVENOUS

## 2021-05-01 MED ORDER — METOPROLOL TARTRATE 5 MG/5ML IV SOLN
5 MG/ML | Freq: Four times a day (QID) | INTRAVENOUS | Status: DC | PRN
Start: 2021-05-01 — End: 2021-05-02
  Administered 2021-05-01: 21:00:00 5 mg via INTRAVENOUS

## 2021-05-01 MED FILL — ATORVASTATIN CALCIUM 40 MG PO TABS: 40 MG | ORAL | Qty: 1

## 2021-05-01 MED FILL — SODIUM CHLORIDE 0.9 % IV SOLN: 0.9 % | INTRAVENOUS | Qty: 500

## 2021-05-01 MED FILL — AMLODIPINE BESYLATE 5 MG PO TABS: 5 MG | ORAL | Qty: 1

## 2021-05-01 MED FILL — NICOTINE 14 MG/24HR TD PT24: 14 MG/24HR | TRANSDERMAL | Qty: 1

## 2021-05-01 MED FILL — PANTOPRAZOLE SODIUM 40 MG PO TBEC: 40 MG | ORAL | Qty: 1

## 2021-05-01 MED FILL — SUCRALFATE 1 G PO TABS: 1 GM | ORAL | Qty: 1

## 2021-05-01 MED FILL — NITROGLYCERIN 0.4 MG SL SUBL: 0.4 MG | SUBLINGUAL | Qty: 25

## 2021-05-01 MED FILL — METOPROLOL TARTRATE 5 MG/5ML IV SOLN: 5 MG/ML | INTRAVENOUS | Qty: 5

## 2021-05-01 MED FILL — LISINOPRIL 20 MG PO TABS: 20 MG | ORAL | Qty: 2

## 2021-05-01 MED FILL — SODIUM CHLORIDE FLUSH 0.9 % IV SOLN: 0.9 % | INTRAVENOUS | Qty: 10

## 2021-05-01 NOTE — Consults (Signed)
[facility]      CONSULTATION  [phone]      Chief Complaint   Patient presents with    Chest Pain     Pt brought in per [facility] with sudden onset of midsternal CP with radiation into jaw, pt at first thought she had a food bolus.  Pt was able to swallow. Slight headache, left breast pain as well.         Reason for consult:  chest pain    Chest pain of uncertain etiology  Abnormal stress test  HTN  Tobacco abuse  Mixed hyperlipidemia  FH of premature CAD    Plan  -She ruled out for MI with serial troponins  -Chest pain resolved.  This makes me think etiology of her presenting complaint is more likely spasm (coronary versus esophageal)  -Her stress test is abnormal, and implies a distal LAD blockage  -I discussed various alternatives for patient.  My suspicion is that if we do a CT or a cath, we will most likely find either normal coronaries or at worst a 70% blockage in distal LAD, which would be medically managed  -Given possibility that her stress test is misleading, especially given the typical nature of her symptoms, her risk factors, and most importantly the fact that her mother had an MI in her 94s, she should have either a CTA or a cath prior to discharge  -After further review and discussion, we have mutually agreed to do a CTA first.  I ordered this STAT but am not sure if it will be done today or tomorrow; I explained to patient that we could do it as an outpatient, however it would be unlikely we could get it done in a timely fashion if it isn't done while in the hospital  -Beta blocker and nitro ordered per CTA protocol  -Further recs pending results of CTA.  If she is found to have significant proximal coronary artery disease, she will need to stay for a cath and PCI  -Patient advised to quit smoking.  She has gone without cigs for 2 days now and plans to go "cold [place]"    History of Present Illness:  Yvonne King is a 56 y.o. patient who presented to the hospital with  complaints of sudden onset severe chest pressure with a feeling of "something stuck in my throat" followed by jaw pain.  I have been asked to provide consultation regarding further management and testing after she had an abnormal stress test this am.    She smokes, has HDL, and has a mother (who is with her), who had an MI in her early 47s.  Patient does not have diabetes.  Her mom is actually a retired cath Lobbyist from [employer].  Her pain came in spasms, only lasting seconds.  However, once she had jaw pain, she called 911 and had EMS bring her to the hospital.  By the time she got here, she was already pain free, and received nitropaste after pain was gone.    She has GERD and has had EGDs, the last > 2 years ago.  No history of esophageal dilation.  She notes she had a similar episode of chest pain about 10 years ago, with a nuclear stress test that she thinks implied lack of blood flow to the distal part of her LAD.    Today, she had good exercise tolerance, >8 minutes on Bruce, no EKG changes, but had reversible ischemia  in apical segments of her LAD.    Past Medical History:   has a past medical history of Endometriosis, GERD (gastroesophageal reflux disease), HTN (hypertension), and Migraine.    Surgical History:   has a past surgical history that includes Hysterectomy; Cesarean section; Appendectomy; pelvic laparoscopy; cyst removal (Right, 07/29/12); and Wrist ganglion excision (07/29/12).     Social History:   reports that she has been smoking. She has been smoking an average of 1 pack per day. She does not have any smokeless tobacco history on file. She reports current alcohol use.     Family History   Problem Relation Age of Onset    Cancer Sister         breast    Heart Failure Mother         mi     Hypertension Father        Home Medications:  Were reviewed and are listed in nursing record. and/or listed below  Prior to Admission medications    Medication Sig Start Date End Date Taking?  Authorizing Provider   atorvastatin (LIPITOR) 20 MG tablet Take by mouth daily 03/15/19  Yes Historical Provider, MD   pantoprazole (PROTONIX) 40 MG tablet Take by mouth 2 times daily 02/13/20  Yes Historical Provider, MD   vitamin D (CHOLECALCIFEROL) 50000 UNIT CAPS Take 1 capsule by mouth once WEEKLY for 6 months, then continue OTC VitD3 2000IU once daily 03/28/21  Yes Historical Provider, MD   amLODIPine (NORVASC) 5 MG tablet Take 5 mg by mouth daily   Yes Historical Provider, MD   nitrofurantoin, macrocrystal-monohydrate, (MACROBID) 100 MG capsule Take 100 mg by mouth 2 times daily Take for 10 days. Start date 3/13   Yes Historical Provider, MD   lisinopril (PRINIVIL;ZESTRIL) 10 MG tablet TAKE ONE TABLET BY MOUTH DAILY  Patient taking differently: Take 40 mg by mouth daily 08/10/14   Alger Memos, MD   aspirin 81 MG tablet Take 81 mg by mouth daily    Severiano Gilbert, DO   cyclobenzaprine (FLEXERIL) 10 MG TABS Take 10 mg by mouth every 8 hours as needed  Patient not taking: No sig reported 09/01/13   Severiano Gilbert, DO        Current Medications:  Current Facility-Administered Medications   Medication Dose Route Frequency Provider Last Rate Last Admin    aspirin EC tablet 81 mg  81 mg Oral Daily Paras Patel, MD        atorvastatin (LIPITOR) tablet 40 mg  40 mg Oral Nightly Lance Bosch, MD   40 mg at 04/30/21 2112    lisinopril (PRINIVIL;ZESTRIL) tablet 40 mg  40 mg Oral Daily Lance Bosch, MD   40 mg at 04/30/21 2112    pantoprazole (PROTONIX) tablet 40 mg  40 mg Oral BID AC Lance Bosch, MD   40 mg at 05/01/21 6283    sodium chloride flush 0.9 % injection 5-40 mL  5-40 mL IntraVENous 2 times per day Lance Bosch, MD   10 mL at 05/01/21 1121    sodium chloride flush 0.9 % injection 5-40 mL  5-40 mL IntraVENous PRN Lance Bosch, MD        0.9 % sodium chloride infusion   IntraVENous PRN Lance Bosch, MD        enoxaparin (LOVENOX) injection 40 mg  40 mg SubCUTAneous Daily Paras Patel, MD        ondansetron (ZOFRAN-ODT)  disintegrating tablet 4 mg  4 mg Oral Q8H  PRN Lance Bosch, MD        Or    ondansetron [facility]) injection 4 mg  4 mg IntraVENous Q6H PRN Lance Bosch, MD        polyethylene glycol (GLYCOLAX) packet 17 g  17 g Oral Daily PRN Lance Bosch, MD        acetaminophen (TYLENOL) tablet 650 mg  650 mg Oral Q6H PRN Lance Bosch, MD        Or    acetaminophen (TYLENOL) suppository 650 mg  650 mg Rectal Q6H PRN Lance Bosch, MD        0.9 % sodium chloride infusion   IntraVENous Continuous Lance Bosch, MD   Stopped at 05/01/21 0807    morphine (PF) injection 2 mg  2 mg IntraVENous Q4H PRN Lance Bosch, MD        sucralfate (CARAFATE) tablet 1 g  1 g Oral 4x Daily AC & HS Lance Bosch, MD   1 g at 05/01/21 1118    nicotine (NICODERM CQ) 14 MG/24HR 1 patch  1 patch TransDERmal Daily Paras Patel, MD        LORazepam (ATIVAN) injection 1 mg  1 mg IntraVENous Q6H PRN Lance Bosch, MD        nitroGLYCERIN (NITROSTAT) SL tablet 0.4 mg  0.4 mg SubLINGual Q5 Min PRN Lance Bosch, MD        amLODIPine (NORVASC) tablet 5 mg  5 mg Oral Nightly Natosha Minick, APRN - CNP   5 mg at 04/30/21 2208        Allergies:  Imitrex [sumatriptan] and Sulfa antibiotics     Review of Systems:     All systems reviewed and negative except as stated above.    Physical Examination:    BP 133/84    Pulse 65    Temp 97.1 ??F (36.2 ??C) (Temporal)    Resp 18    Ht 5' 4.5" (1.638 m)    Wt 157 lb 3.2 oz (71.3 kg)    LMP 03/17/2008    SpO2 98%    BMI 26.57 kg/m??     Body mass index is 26.57 kg/m??.    General Appearance:  Alert, cooperative, no distress, appears stated age   Head:  Normocephalic, without obvious abnormality, atraumatic   Eyes:  Conjunctiva/corneas clear   ENT: Moist mucus membranes   Neck: Supple, trachea midline, no carotid bruit or JVD   Lungs:   Clear to auscultation bilaterally, respirations unlabored   Chest Wall:  No tenderness or deformity   Heart:  Regular rate and rhythm, S1, S2 normal, no murmur, rub or gallop   Abdomen:   Soft, non-tender, no  organomegaly   Extremities: Extremities normal, atraumatic, no cyanosis or edema   Pulses: 2+ and symmetric   Skin: Skin color, texture, turgor normal, no rashes or lesions   Psych: Normal mood and affect   Neurologic: CN II-XII grossly intact        Labs  Lab Results   Component Value Date    TROPONINI <0.01 05/01/2021    TROPONINI <0.01 04/30/2021    TROPONINI <0.01 04/30/2021     Lab Results   Component Value Date    WBC 6.9 05/01/2021    HGB 11.6 (L) 05/01/2021    HCT 35.4 (L) 05/01/2021    MCV 97.6 05/01/2021    PLT 202 05/01/2021     Lab Results   Component Value Date    NA 143 05/01/2021    K 3.9  05/01/2021    CL 110 05/01/2021    CO2 25 05/01/2021    BUN 13 05/01/2021    CREATININE 0.9 05/01/2021    GLUCOSE 89 05/01/2021    CALCIUM 8.9 05/01/2021    PROT 7.0 04/30/2021    LABALBU 4.3 04/30/2021    BILITOT 0.5 04/30/2021    ALKPHOS 96 04/30/2021    AST 17 04/30/2021    ALT 15 04/30/2021    LABGLOM >60 05/01/2021    GFRAA >60 07/16/2012    AGRATIO 1.6 04/30/2021     No results found for: CHOL, CHOLFAST, TRIG, TRIGLYCFAST, HDL, LDLCALC, LABVLDL    The ASCVD Risk score (Arnett DK, et al., 2019) failed to calculate for the following reasons:    Cannot find a previous HDL lab    Cannot find a previous total cholesterol lab    Lab Results   Component Value Date    PROBNP 105 04/30/2021        No results found for: LABA1C, HBA1CPOC     No results found for: TSHFT4, TSH, TSHREFLEX, TSH3GEN    SUMMARY OF CURRENT AND RECENT RELEVANT IMAGING:  Additional Imaging:  Results for orders placed during the hospital encounter of 04/30/21    XR CHEST PORTABLE    Narrative  EXAMINATION:  ONE XRAY VIEW OF THE CHEST    04/30/2021 3:32 pm    COMPARISON:  None.    HISTORY:  ORDERING SYSTEM PROVIDED HISTORY: cp  TECHNOLOGIST PROVIDED HISTORY:  Reason for exam:->cp  Reason for Exam: Chest Pain (Pt brought in per [facility] with sudden onset  of midsternal CP with radiation into jaw, pt at first thought she had a food  bolus.  Pt was  able to swallow. Slight headache, left breast pain as well    FINDINGS:  Normal cardiomediastinal silhouette.  No acute airspace infiltrate.  No  pneumothorax or pleural effusion.  Tracheal air column appears normal.    Impression  No acute cardiopulmonary findings    No results found for this or any previous visit.    SUMMARY OF CURRENT AND RECENT RELEVANT CARDIAC TESTING:    CARDIAC IMAGING/TESTING:  EKG personally interpreted:   Results for orders placed or performed during the hospital encounter of 04/30/21   EKG 12 Lead   Result Value Ref Range    Ventricular Rate 77 BPM    Atrial Rate 77 BPM    P-R Interval 146 ms    QRS Duration 90 ms    Q-T Interval 414 ms    QTc Calculation (Bazett) 468 ms    P Axis 30 degrees    R Axis 50 degrees    T Axis 33 degrees    Diagnosis       Normal sinus rhythmNormal ECGConfirmed by Alden Server ([id number]) on 05/01/2021 11:13:04 AM     Echo:   No results found for this or any previous visit.    TEE:  No results found for this or any previous visit.    Stress:  05/01/2021  Summary  The overall quality of the study is good. There is significant breast and  subdiaphragmatic attenuation.    Left ventricular cavity size is normal. Right ventricle is normal in size.    Spect imaging demonstrates a small area of mildly reduced perfusion in the  apex, apical anterior and apical septum. The defect is mixed ischemia and  scar. There is associated wall motion abnormality.    Sum stress score of 5. No visual  TID. Calculated TID of 0.96.    Left ventricular ejection fraction is normal at 74%.    **Myocardial perfusion is abnormal. Mixed ischemia and scar. Low-moderate  risk scan. Hypertensive response to exercise**      Cath:   No results found for this or any previous visit.     CARDIAC RHYTHM ASSESSMENT:    Holter/Event monitor  No results found for this or any previous visit.    Old notes reviewed  Telemetry reviewd  Ekg personally reviewed  Chest xray personally reviewed  Echo, stress, cath,  and/or other cardiac testing reviewed in detail   Medications and labs reviewed    High complexity/medical decision making due to extensive data review, extensive history review, independent review of data    High risk due to acute illness, evaluation of drug-drug interactions, medication management and diagnostic interventions    Tobacco use was discussed with the patient and educated on the negative effects. I have asked the patient to not utilize these agents.    Thank you for allowing to us to participate in the care or June Leapeborah J Florio. Further evaluation will be based upon the patient's clinical course and testing results.    All questions and concerns were addressed to the patient/family. Alternatives to my treatment were discussed.     Uvaldo RisingWilliam Aaron Sereena Marando, MD, MD 05/01/2021 2:56 PM

## 2021-05-01 NOTE — Progress Notes (Signed)
Hospitalist Progress Note      PCP: Severiano Gilbert, DO    Date of Admission: 04/30/2021    Chief Complaint: Chest pain     Hospital Course:   56 y.o. female with history of tobacco abuse, HTN, GERD came to ER with complaints of chest pain.  Says midsternal chest pain that is sharp and radiating to both sides of neck and jaw.  Associated with SOB.  Lasts for seconds.  Placed in observation.  Serial troponin negative.  SPECT   The overall quality of the study is good. There is significant breast and    subdiaphragmatic attenuation.        Left ventricular cavity size is normal. Right ventricle is normal in size.        Spect imaging demonstrates a small area of mildly reduced perfusion in the    apex, apical anterior and apical septum. The defect is mixed ischemia and    scar. There is associated wall motion abnormality.        Sum stress score of 5. No visual TID. Calculated TID of 0.96.        Left ventricular ejection fraction is normal at 74%.        **Myocardial perfusion is abnormal. Mixed ischemia and scar. Low-moderate    risk scan. Hypertensive response to exercise**       Cardiology consulted to evaluate.    Subjective: Patient denies CP, SOB, HA or abdominal pain.  Has extensive FHx for CAD.         Medications:  Reviewed    Infusion Medications    sodium chloride      sodium chloride Stopped (05/01/21 0807)     Scheduled Medications    aspirin  81 mg Oral Daily    atorvastatin  40 mg Oral Nightly    lisinopril  40 mg Oral Daily    pantoprazole  40 mg Oral BID AC    sodium chloride flush  5-40 mL IntraVENous 2 times per day    enoxaparin  40 mg SubCUTAneous Daily    sucralfate  1 g Oral 4x Daily AC & HS    nicotine  1 patch TransDERmal Daily    amLODIPine  5 mg Oral Nightly     PRN Meds: sodium chloride flush, sodium chloride, ondansetron **OR** ondansetron, polyethylene glycol, acetaminophen **OR** acetaminophen, morphine, LORazepam, nitroGLYCERIN    No intake or output data in the 24 hours ending  05/01/21 1147    Physical Exam Performed:    BP 133/84    Pulse 65    Temp 97.1 ??F (36.2 ??C) (Temporal)    Resp 18    Ht 5' 4.5" (1.638 m)    Wt 157 lb 3.2 oz (71.3 kg)    LMP 03/17/2008    SpO2 98%    BMI 26.57 kg/m??     General appearance:  Appears comfortable. Well nourished  Eyes: Sclera clear, pupils equal  ENT: Moist mucus membranes, no thrush. Trachea midline.  Cardiovascular: Regular rhythm, normal S1, S2. No murmur, gallop, rub. No edema in lower extremities  Respiratory: Clear to auscultation bilaterally, no wheeze, good inspiratory effort  Gastrointestinal: Abdomen soft, non-tender, not distended, normal bowel sounds  Musculoskeletal: No cyanosis in digits, neck supple  Neurology: Cranial nerves grossly intact. Alert and oriented in time, place and person. No speech or motor deficits  Psychiatry: Appropriate affect. Not agitated  Skin: Warm, dry, normal turgor, no rash  Brisk capillary refill, peripheral pulses palpable  Labs:   Recent Labs     04/30/21  1549 05/01/21  0426   WBC 8.3 6.9   HGB 12.9 11.6*   HCT 39.7 35.4*   PLT 244 202     Recent Labs     04/30/21  1549 05/01/21  0426   NA 144 143   K 3.5 3.9   CL 107 110   CO2 27 25   BUN 13 13   CREATININE 0.8 0.9   CALCIUM 9.2 8.9     Recent Labs     04/30/21  1549   AST 17   ALT 15   BILITOT 0.5   ALKPHOS 96     No results for input(s): INR in the last 72 hours.  Recent Labs     04/30/21  1945 04/30/21  2354 05/01/21  0426   TROPONINI <0.01 <0.01 <0.01       Urinalysis:    No results found for: NITRU, WBCUA, BACTERIA, RBCUA, BLOODU, SPECGRAV, GLUCOSEU    Radiology:  NM MYOCARDIAL SPECT REST EXERCISE OR RX   Final Result      XR CHEST PORTABLE   Final Result   No acute cardiopulmonary findings             IP CONSULT TO HOSPITALIST  IP CONSULT TO CARDIOLOGY    Assessment/Plan:    Active Hospital Problems    Diagnosis     Chest pain [R07.9]      Priority: Medium    Tobacco abuse disorder [Z72.0]      Priority: Medium    Essential hypertension [I10]       Chest pain  Has abnormal SPECT  Cardiology consult to evaluate  Negative serial troponin  Continue ASA and Lipitor  NTG PRN  Morphine IV PRN severe pain  Keep NPO for possible CTA Cor vs LHC  GERD  Continue Protonix bid  Resumed Carafate  Use Maalox PRN  3.         Tobacco abuse disorder              -           Counseled on cessation              -           Continue nicotine patch  4.         HTN              -           Continue Lisinopril    DVT Prophylaxis: Lovenox  Diet: Diet NPO  Code Status: Full Code  PT/OT Eval Status: Not needed    Dispo - Home    Discussed with patient, nursing and CM.    Discussed with Dr Alvina Filbert (Cardio).  He will have someone come evaluate for possible CTA Coronary vs LHC.    Keep in observation for now.    Lance Bosch, MD

## 2021-05-01 NOTE — Plan of Care (Signed)
CTA films reviewed by me.    Overall appears to have proximal 90% LAD lesion versus motion artifact  Significant coronary calcifications    Patient will need to stay to have a cath tomorrow for further evaluation and probable stenting to LAD

## 2021-05-01 NOTE — Plan of Care (Signed)
Problem: Discharge Planning  Goal: Discharge to home or other facility with appropriate resources  Outcome: Progressing     Problem: Pain  Goal: Verbalizes/displays adequate comfort level or baseline comfort level  Outcome: Progressing

## 2021-05-01 NOTE — Progress Notes (Signed)
4 Eyes Skin Assessment     NAME:  Yvonne King  DATE OF BIRTH:  Oct 05, 1965  MEDICAL RECORD NUMBER:  [id number]    The patient is being assessed for  Admission    I agree that One RN has performed a thorough Head to Toe Skin Assessment on the patient. ALL assessment sites listed below have been assessed.      Areas assessed by both nurses:    Head, Face, Ears, Shoulders, Back, Chest, Arms, Elbows, Hands, Sacrum. Buttock, Coccyx, Ischium, and Legs. Feet and Heels        Does the Patient have a Wound? No noted wound(s)       Braden Prevention initiated by RN: No   Wound Care Orders initiated by RN: No    Pressure Injury (Stage 3,4, Unstageable, DTI, NWPT, and Complex wounds) if present, place referral order by RN under ORDER ENTRY: No    New and Established Ostomies, if present place, referral order under ORDER ENTRY: No      Nurse 1 eSignature: Electronically signed by Blain Pais, RN on 05/01/21 at 12:35 AM EDT    **SHARE this note so that the co-signing nurse can place an eSignature**    Nurse 2 eSignature: Electronically signed by Horald Pollen, RN on 05/01/21 at 12:41 AM EDT

## 2021-05-01 NOTE — Progress Notes (Signed)
Pt gone down to stress test via wc with transport.

## 2021-05-01 NOTE — Progress Notes (Signed)
Patient instructed on Bruce Protocol Stress Test Procedure including possible side effects and adverse reactions.  Verbalizes knowledge and understanding and denies having any questions.

## 2021-05-01 NOTE — Progress Notes (Signed)
Pt returned from stress test, md notified of stress test result.

## 2021-05-01 NOTE — Plan of Care (Signed)
Problem: Pain  Goal: Verbalizes/displays adequate comfort level or baseline comfort level  Outcome: Progressing    Problem: Gastrointestinal - Adult  Goal: Minimal or absence of nausea and vomiting  Outcome: Progressing  Flowsheets (Taken 05/01/2021 0817)  Minimal or absence of nausea and vomiting: Administer IV fluids as ordered to ensure adequate hydration   Problem: Cardiovascular - Adult  Goal: Maintains optimal cardiac output and hemodynamic stability  Outcome: Progressing  Flowsheets (Taken 05/01/2021 0817)  Maintains optimal cardiac output and hemodynamic stability:   Monitor blood pressure and heart rate   Administer fluid and/or volume expanders as ordered  Goal: Absence of cardiac dysrhythmias or at baseline  Outcome: Progressing  Flowsheets (Taken 05/01/2021 0817)  Absence of cardiac dysrhythmias or at baseline:   Monitor cardiac rate and rhythm   Assess for signs of decreased cardiac output

## 2021-05-02 LAB — CBC WITH AUTO DIFFERENTIAL
Basophils %: 0.9 %
Basophils Absolute: 0.1 10*3/uL (ref 0.0–0.2)
Eosinophils %: 4 %
Eosinophils Absolute: 0.3 10*3/uL (ref 0.0–0.6)
Hematocrit: 35.9 % — ABNORMAL LOW (ref 36.0–48.0)
Hemoglobin: 12 g/dL (ref 12.0–16.0)
Lymphocytes %: 31.9 %
Lymphocytes Absolute: 2.5 10*3/uL (ref 1.0–5.1)
MCH: 32.8 pg (ref 26.0–34.0)
MCHC: 33.4 g/dL (ref 31.0–36.0)
MCV: 98.2 fL (ref 80.0–100.0)
MPV: 7.9 fL (ref 5.0–10.5)
Monocytes %: 7 %
Monocytes Absolute: 0.6 10*3/uL (ref 0.0–1.3)
Neutrophils %: 56.2 %
Neutrophils Absolute: 4.5 10*3/uL (ref 1.7–7.7)
Platelets: 200 10*3/uL (ref 135–450)
RBC: 3.66 M/uL — ABNORMAL LOW (ref 4.00–5.20)
RDW: 13.9 % (ref 12.4–15.4)
WBC: 8 10*3/uL (ref 4.0–11.0)

## 2021-05-02 LAB — BASIC METABOLIC PANEL W/ REFLEX TO MG FOR LOW K
Anion Gap: 7 (ref 3–16)
BUN: 15 mg/dL (ref 7–20)
CO2: 25 mmol/L (ref 21–32)
Calcium: 9.2 mg/dL (ref 8.3–10.6)
Chloride: 111 mmol/L — ABNORMAL HIGH (ref 99–110)
Creatinine: 0.9 mg/dL (ref 0.6–1.1)
Est, Glom Filt Rate: >60 (ref >60)
Glucose: 86 mg/dL (ref 70–99)
Potassium reflex Magnesium: 4.2 mmol/L (ref 3.5–5.1)
Sodium: 143 mmol/L (ref 136–145)

## 2021-05-02 LAB — EJECTION FRACTION PERCENTAGE: Left Ventricular Ejection Fraction: 60 %

## 2021-05-02 LAB — POC ACT-LR
POC ACT LR: 240 s
POC ACT LR: 287 s

## 2021-05-02 LAB — HEMOGLOBIN A1C
Hemoglobin A1C: 5.7 %
eAG: 116.9 mg/dL

## 2021-05-02 MED ORDER — ATORVASTATIN CALCIUM 40 MG PO TABS
40 MG | ORAL_TABLET | Freq: Every evening | ORAL | 3 refills | Status: AC
Start: 2021-05-02 — End: 2021-10-22

## 2021-05-02 MED ORDER — IOPAMIDOL 76 % IV SOLN
76 % | Freq: Once | INTRAVENOUS | Status: AC
Start: 2021-05-02 — End: 2021-05-02
  Administered 2021-05-02: 18:00:00 140 mL via INTRAVENOUS

## 2021-05-02 MED ORDER — LIDOCAINE HCL 1% INJ (MIXTURES ONLY)
1 % | INTRAMUSCULAR | Status: AC
Start: 2021-05-02 — End: ?

## 2021-05-02 MED ORDER — SODIUM CHLORIDE 0.9 % IV SOLN
0.9 % | INTRAVENOUS | Status: DC | PRN
Start: 2021-05-02 — End: 2021-05-02

## 2021-05-02 MED ORDER — MIDAZOLAM HCL 2 MG/2ML IJ SOLN
2 MG/ML | INTRAMUSCULAR | Status: AC
Start: 2021-05-02 — End: ?

## 2021-05-02 MED ORDER — ACETAMINOPHEN 325 MG PO TABS
325 MG | ORAL | Status: DC | PRN
Start: 2021-05-02 — End: 2021-05-02
  Administered 2021-05-02: 21:00:00 650 mg via ORAL

## 2021-05-02 MED ORDER — NITROGLYCERIN IN D5W 200-5 MCG/ML-% IV SOLN
200-5 MCG/ML-% | INTRAVENOUS | Status: AC
Start: 2021-05-02 — End: ?

## 2021-05-02 MED ORDER — VERAPAMIL HCL 2.5 MG/ML IV SOLN
2.5 MG/ML | INTRAVENOUS | Status: AC
Start: 2021-05-02 — End: ?

## 2021-05-02 MED ORDER — ISOSORBIDE MONONITRATE ER 30 MG PO TB24
30 MG | Freq: Every day | ORAL | Status: DC
Start: 2021-05-02 — End: 2021-05-02
  Administered 2021-05-02: 20:00:00 30 mg via ORAL

## 2021-05-02 MED ORDER — HEPARIN SODIUM (PORCINE) 1000 UNIT/ML IJ SOLN
1000 UNIT/ML | INTRAMUSCULAR | Status: AC
Start: 2021-05-02 — End: ?

## 2021-05-02 MED ORDER — ISOSORBIDE MONONITRATE ER 30 MG PO TB24
30 MG | ORAL_TABLET | Freq: Every day | ORAL | 2 refills | Status: DC
Start: 2021-05-02 — End: 2021-05-09

## 2021-05-02 MED ORDER — ADENOSINE (DIAGNOSTIC) 3 MG/ML IV SOLN
3 MG/ML | INTRAVENOUS | Status: AC
Start: 2021-05-02 — End: ?

## 2021-05-02 MED ORDER — ONDANSETRON HCL 4 MG/2ML IJ SOLN
4 MG/2ML | INTRAMUSCULAR | Status: AC
Start: 2021-05-02 — End: ?

## 2021-05-02 MED ORDER — NORMAL SALINE FLUSH 0.9 % IV SOLN
0.9 % | INTRAVENOUS | Status: DC | PRN
Start: 2021-05-02 — End: 2021-05-02

## 2021-05-02 MED ORDER — SODIUM CHLORIDE 0.9 % IV SOLN
0.9 % | INTRAVENOUS | Status: AC
Start: 2021-05-02 — End: ?

## 2021-05-02 MED ORDER — FENTANYL CITRATE (PF) 100 MCG/2ML IJ SOLN
100 MCG/2ML | INTRAMUSCULAR | Status: AC
Start: 2021-05-02 — End: ?

## 2021-05-02 MED ORDER — SUCRALFATE 1 G PO TABS
1 GM | ORAL_TABLET | Freq: Three times a day (TID) | ORAL | 0 refills | Status: AC | PRN
Start: 2021-05-02 — End: ?

## 2021-05-02 MED ORDER — HEPARIN (PORCINE) IN NACL 1000-0.9 UT/500ML-% IV SOLN
INTRAVENOUS | Status: AC
Start: 2021-05-02 — End: ?

## 2021-05-02 MED ORDER — NORMAL SALINE FLUSH 0.9 % IV SOLN
0.9 % | Freq: Two times a day (BID) | INTRAVENOUS | Status: DC
Start: 2021-05-02 — End: 2021-05-02

## 2021-05-02 MED ORDER — LISINOPRIL 40 MG PO TABS
40 MG | ORAL_TABLET | Freq: Every day | ORAL | 3 refills | Status: AC
Start: 2021-05-02 — End: 2021-10-22

## 2021-05-02 MED FILL — PANTOPRAZOLE SODIUM 40 MG PO TBEC: 40 MG | ORAL | Qty: 1

## 2021-05-02 MED FILL — LISINOPRIL 20 MG PO TABS: 20 MG | ORAL | Qty: 2

## 2021-05-02 MED FILL — HEPARIN SODIUM (PORCINE) 1000 UNIT/ML IJ SOLN: 1000 UNIT/ML | INTRAMUSCULAR | Qty: 10

## 2021-05-02 MED FILL — MAPAP 325 MG PO TABS: 325 MG | ORAL | Qty: 2

## 2021-05-02 MED FILL — MIDAZOLAM HCL 2 MG/2ML IJ SOLN: 2 MG/ML | INTRAMUSCULAR | Qty: 2

## 2021-05-02 MED FILL — ONDANSETRON HCL 4 MG/2ML IJ SOLN: 4 MG/2ML | INTRAMUSCULAR | Qty: 2

## 2021-05-02 MED FILL — ADENOSINE 3 MG/ML IV SOLN: 3 MG/ML | INTRAVENOUS | Qty: 30

## 2021-05-02 MED FILL — ASPIRIN LOW DOSE 81 MG PO TBEC: 81 MG | ORAL | Qty: 1

## 2021-05-02 MED FILL — SUCRALFATE 1 G PO TABS: 1 GM | ORAL | Qty: 1

## 2021-05-02 MED FILL — AMLODIPINE BESYLATE 5 MG PO TABS: 5 MG | ORAL | Qty: 1

## 2021-05-02 MED FILL — ATORVASTATIN CALCIUM 40 MG PO TABS: 40 MG | ORAL | Qty: 1

## 2021-05-02 MED FILL — FENTANYL CITRATE (PF) 100 MCG/2ML IJ SOLN: 100 MCG/2ML | INTRAMUSCULAR | Qty: 2

## 2021-05-02 MED FILL — VERAPAMIL HCL 2.5 MG/ML IV SOLN: 2.5 MG/ML | INTRAVENOUS | Qty: 2

## 2021-05-02 MED FILL — NITROGLYCERIN IN D5W 200-5 MCG/ML-% IV SOLN: 200-5 MCG/ML-% | INTRAVENOUS | Qty: 250

## 2021-05-02 MED FILL — ISOSORBIDE MONONITRATE ER 30 MG PO TB24: 30 MG | ORAL | Qty: 1

## 2021-05-02 MED FILL — SODIUM CHLORIDE 0.9 % IV SOLN: 0.9 % | INTRAVENOUS | Qty: 50

## 2021-05-02 MED FILL — HEPARIN (PORCINE) IN NACL 1000-0.9 UT/500ML-% IV SOLN: INTRAVENOUS | Qty: 1000

## 2021-05-02 MED FILL — LIDOCAINE HCL (PF) 1 % IJ SOLN: 1 % | INTRAMUSCULAR | Qty: 30

## 2021-05-02 MED FILL — LORAZEPAM 2 MG/ML IJ SOLN: 2 MG/ML | INTRAMUSCULAR | Qty: 1

## 2021-05-02 NOTE — Discharge Summary (Signed)
[facility] DISCHARGE SUMMARY    Patient Demographics    Patient. Yvonne King  Date of Birth. September 04, 1965  MRN. [id number]     Primary care provider. Lyman Bishop, APRN - CNP  (Tel: [phone])    Admit date: 04/30/2021    Discharge date (blank if same as Note Date): 05/02/2021  Note Date: 05/04/2021     Reason for Hospitalization.   Chief Complaint   Patient presents with    Chest Pain     Pt brought in per [facility] with sudden onset of midsternal CP with radiation into jaw, pt at first thought she had a food bolus.  Pt was able to swallow. Slight headache, left breast pain as well.        Significant Findings.   Principal Problem:    Angina pectoris (HCC)  Active Problems:    Tobacco abuse disorder    Essential hypertension  Resolved Problems:    * No resolved hospital problems. [facility] Course.  Yvonne King is a 56 y.o. female with a past medical history of hypertension, GERD, migraines who presented to the ER with midsternal CP with radiation to neck and jaw.  Associated with SOB.  Trop negative.  Had abnormal stress test, had abnormal CTA.showing disease in the LAD region.  S/p LHC 3/16 showed mild nonobstructive disease and risk factor reduction plus medical therapy recommended by cards.  She was cleared for discharge.     Assessment and Plan:  Chest Pain/CAD    - Admitted with sternal chest pain   - Abnormal CTA and s/p LHC with mild to moderate non-obstructive CAD   - Feels good post cath, Imdur added, Cleared for discharge when restrictions are up  Tobacco Abuse Disorder   - Recommended complete cessation  Hypertension   - Controlled.  Continue current medical therapy  GERD   - Continue Protonix    Discharge recommendations given to patient.  Follow Up. PCP in 1 week, cardiology 2 weeks  Disposition.  home  Activity. activity as tolerated, limit lifting to right arm for 1 week  Diet: No diet orders on file      Problems and results from this  hospitalization that need follow up.  Chest Pain/CAD: Imdur and cards follow up in 2 weeks    Significant test results and incidental findings.    CTA CARDIAC W C STRC MORP W CONTRAST   Final Result   Scattered calcified and noncalcified plaque is seen throughout the coronary   arteries with scattered areas of mild luminal narrowing.  No flow limiting   stenosis noted with the reservation that the study is mildly limited by phase   misregistration artifact.      Punctate pulmonary nodule right middle lobe.  Please see follow-up   recommendations below      Calcium score 332.  This is in the 90th percentile for patient age      RECOMMENDATIONS:   Fleischner Society guidelines for follow-up and management of incidentally   detected pulmonary nodules:      Single Solid Nodule:      Nodule size less than 6 mm   In a low-risk patient, no routine follow-up.   In a high-risk patient, optional CT at 12 months.      - Low risk patients include individuals with minimal or absent history of   smoking and other known risk factors.      - High risk  patients include individuals with a history or smoking or known   risk factors.      Radiology 2017 http://pubs.https://www.stafford-myers.com/rsna.org/doi/full/10.1148/radiol.[email or web address]         NM MYOCARDIAL SPECT REST EXERCISE OR RX   Final Result      XR CHEST PORTABLE   Final Result   No acute cardiopulmonary findings           Invasive procedures and treatments.     Left Heart Cath  Dominance: Right      LM: normal   LAD: 40% ostial, 30% mid stenoses  LCx: 20% mid lesion   RCA: 50% mid stenosis      LVEDP: 14 mmHg  LVEF: 60-65%        [employer] Sci Cometwire     6 Fr XB 3.0 Guide  Proximal LAD  iFR=0.99  FFR=0.97      6 Fr JR4 Guide  Mid RCA  iFR=0.99  FFR=0.91      Impression/Recommendations:  Mild to moderate nonobstructive CAD, proximal LAD and mid RCA stenoses deferred by FFR evaluations.  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  ASA 81 mg, increased dose Atorvastatin, home Amlodipine.   Add Imdur.  OK to DC  home today with close outpatient follow up.   Consults.  IP CONSULT TO HOSPITALIST  IP CONSULT TO CARDIOLOGY    Physical examination on discharge day.   BP 106/66    Pulse 58    Temp 97.1 ??F (36.2 ??C) (Temporal)    Resp 18    Ht 5' 4.5" (1.638 m)    Wt 155 lb (70.3 kg)    LMP 03/17/2008    SpO2 97%    BMI 26.19 kg/m??     General appearance.  Alert. Looks comfortable. Well nourished.   HEENT. Sclera clear. Moist mucus membranes.   Cardiovascular. Regular rate and rhythm, normal S1, S2. No murmur. No significant peripheral edema  Respiratory. Breathing unlabored, not using accessory muscles. Clear to auscultation bilaterally, no wheeze, crackles or rhonchi.  Gastrointestinal. Abdomen soft, non-tender, not distended, normal bowel sounds.  Neurology. Facial symmetry. No speech deficits. Moving all extremities equally.  Extremities. No focal weakness. Pulses palpable.   Skin. Warm, dry, normal turgor    Condition at time of discharge: Good    Medication instructions provided to patient at discharge.     Medication List        START taking these medications      isosorbide mononitrate 30 MG extended release tablet  Commonly known as: IMDUR  Take 1 tablet by mouth daily  Notes to patient: Use: for heart-related chest pain, heart failure, or esophageal spasms  Side effects: dizzy, headache     sucralfate 1 GM tablet  Commonly known as: CARAFATE  Take 1 tablet by mouth 3 times daily as needed (GERD)  Notes to patient: Use: antacid, treat ulcer  Side effects: constipation            CHANGE how you take these medications      atorvastatin 40 MG tablet  Commonly known as: LIPITOR  Take 1 tablet by mouth at bedtime  What changed:   medication strength  how much to take  when to take this     lisinopril 40 MG tablet  Commonly known as: PRINIVIL;ZESTRIL  Take 1 tablet by mouth daily  What changed:   medication strength  See the new instructions.            CONTINUE taking these medications  amLODIPine 5 MG tablet  Commonly  known as: NORVASC  Notes to patient: CALCIUM CHANNEL BLOCKERS  Use:  treats fast heartbeat and high blood pressure.  Side effects:  headache, dizziness and fluid retention/swelling .      aspirin 81 MG tablet  Notes to patient: Use: prevent heart attack/stroke  Side effects: bruising, upset stomach     nitrofurantoin (macrocrystal-monohydrate) 100 MG capsule  Commonly known as: MACROBID  Notes to patient: Antibiotic  Use: treats infections caused by bacteria   Side effects: may cause diarrhea or stomach upset     pantoprazole 40 MG tablet  Commonly known as: PROTONIX  Notes to patient: Use: Prevention and treatment of gastric ulcers  Side Effects: Headache, fatigue, constipation, dry  mouth     vitamin D 56387 UNIT Caps  Commonly known as: CHOLECALCIFEROL  Notes to patient: Dietary/vitamin supplement  Side effects: discolored urine             STOP taking these medications      Flexeril 10 MG tablet  Generic drug: cyclobenzaprine     hydroCHLOROthiazide 25 MG tablet  Commonly known as: HYDRODIURIL               Where to Get Your Medications        These medications were sent to [facility] [address] [place] - [address] - P [phone] Carmon Ginsberg [phone]  [address], [place] [address]      Phone: [phone]   isosorbide mononitrate 30 MG extended release tablet  sucralfate 1 GM tablet       You can get these medications from any pharmacy    Bring a paper prescription for each of these medications  atorvastatin 40 MG tablet  lisinopril 40 MG tablet       Spent 35 minutes in discharge process.    Signed:  Bettye Boeck, APRN - CNP     05/04/2021 2:10 PM

## 2021-05-02 NOTE — Plan of Care (Signed)
Problem: Pain  Goal: Verbalizes/displays adequate comfort level or baseline comfort level  Outcome: Progressing  Flowsheets (Taken 05/02/2021 1157)  Verbalizes/displays adequate comfort level or baseline comfort level: Encourage patient to monitor pain and request assistance     Problem: Cardiovascular - Adult  Goal: Maintains optimal cardiac output and hemodynamic stability  Outcome: Progressing  Flowsheets (Taken 05/02/2021 1157)  Maintains optimal cardiac output and hemodynamic stability:   Monitor blood pressure and heart rate   Assess for signs of decreased cardiac output   Administer fluid and/or volume expanders as ordered  Goal: Absence of cardiac dysrhythmias or at baseline  Outcome: Progressing  Flowsheets (Taken 05/02/2021 1157)  Absence of cardiac dysrhythmias or at baseline: Monitor cardiac rate and rhythm     Problem: Gastrointestinal - Adult  Goal: Minimal or absence of nausea and vomiting  Outcome: Progressing  Flowsheets (Taken 05/02/2021 1157)  Minimal or absence of nausea and vomiting: Administer IV fluids as ordered to ensure adequate hydration

## 2021-05-02 NOTE — Progress Notes (Signed)
Data- discharge order received, pt verbalized agreement to discharge, disposition to previous residence, no needs for HHC/DME.     Action- discharge instructions prepared and given to pt, pt verbalized understanding. Medication information packet given r/t NEW and/or CHANGED prescriptions emphasizing name/purpose/side effects, pt verbalized understanding. Discharge instruction summary: Diet- regular, Activity- post radial LHC instructions, Primary Care Physician as follows: Lyman Bishop, APRN - CNP [phone] f/u appointment in 1 week, immunizations reviewed and n/a, prescription medications filled at [facility]. Inpatient surgical procedure precautions reviewed: post radial LHC instructions, CHF Education reviewed. Pt/ Family has had a total of 60 minutes CHF education this admission encounter.     1. WEIGHT: Admit Weight: 150 lb (68 kg) (04/30/21 1513)        Today  Weight: 155 lb (70.3 kg) (05/02/21 0413)       2. O2 SAT.: SpO2: 97 % (05/02/21 1744)    Response- Pt belongings gathered, IV removed. Disposition is home (no HHC/DME needs), transported with husband, taken to lobby via w/c w/ RN, no complications.

## 2021-05-02 NOTE — Pre Sedation (Signed)
Pre-Op Note/Sedation Assessment    Yvonne King  07-03-1965  [id number]  1:20 PM    Planned Procedure: Cardiac Catheterization Procedure  Post Procedure Plan: Return to same level of care  Consent: I have discussed with the patient and/or the patient representative the indication, alternatives, and the possible risks and/or complications of the planned procedure and the anesthesia methods. The patient and/or patient representative appear to understand and agree to proceed.    Chief Complaint:   Anginal Equivalent      Indications for Cath Procedure:  Presentation:  New Onset Angina <= 2 months  2.  Anginal Classification within 2 weeks:  CCS IV - Inability to perform any activity without angina or angina at rest, i.e., severe limitation  3.  Angina Symptoms Assessment:  Typical Chest Pain  4.  Heart Failure Class within last 2 weeks:  No symptoms  5.  Cardiovascular Instability:  No    Prior Ischemic Workup/Eval:  Pre-Procedural Medications: Yes: ACE/ARB/ARNI, Aspirin, Ca Channel Blockers, and STATIN  2.   Stress Test Completed?  Yes:  Stress or Imaging Studies Performed (within ANY time period):   Type:  Cardiac CTA  Results:  Positive:  Abnormal CTA/MRI Extent of Ischemia:  Intermediate    Does Patient need surgery?  Cath Valve Surgery:  No    Pre-Procedure Medical History:  Vital Signs:  BP 127/69    Pulse 62    Temp 97.2 ??F (36.2 ??C) (Temporal)    Resp 18    Ht 5' 4.5" (1.638 m)    Wt 155 lb (70.3 kg)    LMP 03/17/2008    SpO2 92%    BMI 26.19 kg/m??     Allergies:    Allergies   Allergen Reactions    Imitrex [Sumatriptan] Other (See Comments)     Bradycardia     Sulfa Antibiotics Hives     Medications:    Current Facility-Administered Medications   Medication Dose Route Frequency Provider Last Rate Last Admin    metoprolol (LOPRESSOR) injection 5 mg  5 mg IntraVENous Q6H PRN Uvaldo Rising, MD   5 mg at 05/01/21 1712    aspirin EC tablet 81 mg  81 mg Oral Daily Lance Bosch, MD   81 mg at 05/01/21  2013    atorvastatin (LIPITOR) tablet 40 mg  40 mg Oral Nightly Lance Bosch, MD   40 mg at 05/01/21 2014    lisinopril (PRINIVIL;ZESTRIL) tablet 40 mg  40 mg Oral Daily Lance Bosch, MD   40 mg at 05/01/21 2013    pantoprazole (PROTONIX) tablet 40 mg  40 mg Oral BID AC Lance Bosch, MD   40 mg at 05/02/21 0603    sodium chloride flush 0.9 % injection 5-40 mL  5-40 mL IntraVENous 2 times per day Lance Bosch, MD   10 mL at 05/01/21 2006    sodium chloride flush 0.9 % injection 5-40 mL  5-40 mL IntraVENous PRN Lance Bosch, MD        0.9 % sodium chloride infusion   IntraVENous PRN Lance Bosch, MD        enoxaparin (LOVENOX) injection 40 mg  40 mg SubCUTAneous Daily Paras Patel, MD        ondansetron (ZOFRAN-ODT) disintegrating tablet 4 mg  4 mg Oral Q8H PRN Lance Bosch, MD        Or    ondansetron (ZOFRAN) injection 4 mg  4 mg IntraVENous Q6H PRN Lance Bosch, MD  polyethylene glycol (GLYCOLAX) packet 17 g  17 g Oral Daily PRN Lance Bosch, MD        acetaminophen (TYLENOL) tablet 650 mg  650 mg Oral Q6H PRN Lance Bosch, MD        Or    acetaminophen (TYLENOL) suppository 650 mg  650 mg Rectal Q6H PRN Lance Bosch, MD        0.9 % sodium chloride infusion   IntraVENous Continuous Lance Bosch, MD 75 mL/hr at 05/02/21 1127 New Bag at 05/02/21 1127    morphine (PF) injection 2 mg  2 mg IntraVENous Q4H PRN Lance Bosch, MD        sucralfate (CARAFATE) tablet 1 g  1 g Oral 4x Daily AC & HS Lance Bosch, MD   1 g at 05/02/21 7062    nicotine (NICODERM CQ) 14 MG/24HR 1 patch  1 patch TransDERmal Daily Lance Bosch, MD        LORazepam (ATIVAN) injection 1 mg  1 mg IntraVENous Q6H PRN Lance Bosch, MD   1 mg at 05/01/21 2221    nitroGLYCERIN (NITROSTAT) SL tablet 0.4 mg  0.4 mg SubLINGual Q5 Min PRN Lance Bosch, MD   0.4 mg at 05/01/21 1718    amLODIPine (NORVASC) tablet 5 mg  5 mg Oral Nightly Natosha Minick, APRN - CNP   5 mg at 05/01/21 2013       Past Medical History:    Past Medical History:   Diagnosis Date    Endometriosis      GERD (gastroesophageal reflux disease)     HTN (hypertension)     Migraine        Surgical History:    Past Surgical History:   Procedure Laterality Date    APPENDECTOMY      CESAREAN SECTION      CYST REMOVAL Right 07/29/12    GANGLION CYST REMOVAL RIGHT WRIST    HYSTERECTOMY (CERVIX STATUS UNKNOWN)      endometriossi has one ovary    PELVIC LAPAROSCOPY      WRIST GANGLION EXCISION  07/29/12    right wrist       Pre-Sedation:  Pre-Sedation Documentation and Exam:  I have assessed the patient and reviewed the H&P on the chart.    Prior History of Anesthesia Complications:   none    Modified Mallampati:  II (soft palate, uvula, fauces visible)    ASA Classification:  Class 3 - A patient with severe systemic disease that limits activity but is not incapacitating    Aldrete Scale:  Activity:  2 - Able to move 4 extremities voluntarily on command  Respiration:  2 - Able to breathe deeply and cough freely  Circulation:  2 - BP+/- of normal  Consciousness:  2 - Fully awake  Oxygen Saturation (color):  2 - Able to maintain oxygen saturation >92% on room air    Sedation/Anesthesia Plan:  Guard the patient's safety and welfare.  Minimize physical discomfort and pain.  Minimize negative psychological responses to treatment by providing sedation and analgesia and maximize the potential amnesia.  Patient to meet pre-procedure discharge plan.    Medication Planned:  midazolam intravenously and fentanyl intravenously    Patient is an appropriate candidate for plan of sedation:   yes      Nolon Lennert, DO, [facility]  Interventional Cardiology     o: [phone]  [address], [address]  [place], [place] [address]

## 2021-05-02 NOTE — Progress Notes (Signed)
Pt gone down for cath via bed with cath lab staff.

## 2021-05-02 NOTE — Discharge Instructions (Signed)
Radial Angiogram      Care of your puncture site:  Remove clear bandage 24 hours after the procedure.  May shower in 24 hours  Inspect the site daily and gently clean using soap and water.  Dry thoroughly and apply a Band-Aid.    Normal Observations:  Soreness or tenderness which may last one week.  Mild oozing from the incision site.  Possible bruising that could last 2 weeks.    Activity:  You may resume driving 24 hours following the procedure.  You may resume normal activity in 3 days or after the wound heals.  Avoid lifting more than 10 pounds for 3 days with affected arm.    Nutrition:  Regular diet   Drink at least 8 to 10 glasses of decaffeinated, non-alcoholic fluid for the next 24 hours to flush the x-ray dye used for your angiogram out of your body.    Call your doctor immediately if your condition worsens, for any other concerns, for a follow-up appointment or if you experience any of the following:  Significant bleeding that does not stop after 10 minutes of applying firm pressure on the puncture site.  Increased swelling of the wrist.  Unusual pain, numbness, or tingling of the wrist/arm.   Any signs of infection such as: redness, yellow drainage at the site, swelling or pain.    Other Instructions:  Hold Metformin or Metformin containing drugs for 48 hours after procedure

## 2021-05-02 NOTE — Op Note (Signed)
Cardiac Catheterization     Procedure: LHC, LVG, FFR-LAD, FFR-RCA   Complications: None  Medications: Procedural sedation with minimal conscious sedation (5 Versed/100 Fentanyl)  An independent trained observer pushed medications at my direction. We monitored the patient's level of consciousness and vital signs/physiologic status throughout the procedure duration (see start and stop times in log).  Estimated Blood Loss: Less than 20 mL  Specimens: were not obtained  Access: 6 Fr Right Radial   Closure: TR Band   Contrast: 140 cc  Start time: 1330  Stop time: 1422  Fluoro time: 8.1  Findings:     Left Heart Cath  Dominance: Right     LM: normal   LAD: 40% ostial, 30% mid stenoses  LCx: 20% mid lesion   RCA: 50% mid stenosis     LVEDP: 14 mmHg  LVEF: 60-65%      [place] Sci Cometwire    6 Fr XB 3.0 Guide  Proximal LAD  iFR=0.99  FFR=0.97     6 Fr JR4 Guide  Mid RCA  iFR=0.99  FFR=0.91     Impression/Recommendations:  Mild to moderate nonobstructive CAD, proximal LAD and mid RCA stenoses deferred by FFR evaluations.  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  ASA 81 mg, increased dose Atorvastatin, home Amlodipine.   Add Imdur.  OK to DC home today with close outpatient follow up.       Nolon Lennert, DO, [facility]  Interventional Cardiology     o: [phone]  [address]  [place], [place] [address]

## 2021-05-02 NOTE — Progress Notes (Signed)
Cardiovascular Progress Note      Chief Complaint:   Chief Complaint   Patient presents with    Chest Pain     Pt brought in per [facility] with sudden onset of midsternal CP with radiation into jaw, pt at first thought she had a food bolus.  Pt was able to swallow. Slight headache, left breast pain as well.      Impression/Recommendations:    Chest pain, new onset angina  Abnormal stress test/coronary CTA  Hypertension  Hyperlipidemia, mixed  Family history of ischemic heart disease, premature   Tobacco abuse   GERD       Mild to moderate nonobstructive CAD, proximal LAD and mid RCA stenoses deferred by FFR evaluations.  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  ASA 81 mg, increased dose Atorvastatin, home Amlodipine.   Add Imdur.  OK to DC home today with close outpatient follow up.     Interval History:  No events overnight. Discussed with Ronnika, her husband, and mother Jamesetta So (former cath Designer, industrial/product at [employer]).  Increased stress levels- moved back to hometown of [place] and son/grandchildren living with them.   Presenting pain described as chest pressure/fullness into throat at rest.    05/01/2021  Summary  The overall quality of the study is good. There is significant breast and  subdiaphragmatic attenuation.     Left ventricular cavity size is normal. Right ventricle is normal in size.     Spect imaging demonstrates a small area of mildly reduced perfusion in the  apex, apical anterior and apical septum. The defect is mixed ischemia and  scar. There is associated wall motion abnormality.     Sum stress score of 5. No visual TID. Calculated TID of 0.96.     Left ventricular ejection fraction is normal at 74%.     **Myocardial perfusion is abnormal. Mixed ischemia and scar. Low-moderate  risk scan. Hypertensive response to exercise**          Medications:  metoprolol (LOPRESSOR) injection 5 mg, Q6H PRN  aspirin EC tablet 81 mg, Daily  atorvastatin (LIPITOR) tablet 40 mg, Nightly  lisinopril  (PRINIVIL;ZESTRIL) tablet 40 mg, Daily  pantoprazole (PROTONIX) tablet 40 mg, BID AC  sodium chloride flush 0.9 % injection 5-40 mL, 2 times per day  sodium chloride flush 0.9 % injection 5-40 mL, PRN  0.9 % sodium chloride infusion, PRN  enoxaparin (LOVENOX) injection 40 mg, Daily  ondansetron (ZOFRAN-ODT) disintegrating tablet 4 mg, Q8H PRN   Or  ondansetron (ZOFRAN) injection 4 mg, Q6H PRN  polyethylene glycol (GLYCOLAX) packet 17 g, Daily PRN  acetaminophen (TYLENOL) tablet 650 mg, Q6H PRN   Or  acetaminophen (TYLENOL) suppository 650 mg, Q6H PRN  0.9 % sodium chloride infusion, Continuous  morphine (PF) injection 2 mg, Q4H PRN  sucralfate (CARAFATE) tablet 1 g, 4x Daily AC & HS  nicotine (NICODERM CQ) 14 MG/24HR 1 patch, Daily  LORazepam (ATIVAN) injection 1 mg, Q6H PRN  nitroGLYCERIN (NITROSTAT) SL tablet 0.4 mg, Q5 Min PRN  amLODIPine (NORVASC) tablet 5 mg, Nightly        I/O:     Intake/Output Summary (Last 24 hours) at 05/02/2021 1322  Last data filed at 05/01/2021 2208  Gross per 24 hour   Intake 500 ml   Output --   Net 500 ml       Physical Exam:    BP 127/69    Pulse 62    Temp 97.2 ??F (36.2 ??C) (Temporal)  Resp 18    Ht 5' 4.5" (1.638 m)    Wt 155 lb (70.3 kg)    LMP 03/17/2008    SpO2 92%    BMI 26.19 kg/m??   Wt Readings from Last 3 Encounters:   05/02/21 155 lb (70.3 kg)   12/07/13 170 lb (77.1 kg)   09/01/13 167 lb (75.8 kg)       GENERAL: Well developed, well nourished, no acute distress  NEUROLOGICAL: Alert and oriented x3  PSYCH: Normal mood and affect   SKIN: Warm and dry, without lesions  HEENT: Normocephalic, atraumatic, Sclera non-icteric, mucous membranes moist  NECK: supple, JVP normal, thyroid not enlarged   CAROTID: Normal upstroke, no bruits  CARDIAC: Normal PMI, regular rate and rhythm, normal S1S2, no murmur, rub  RESPIRATORY: Normal respiratory effort, clear to auscultation bilaterally  EXTREMITIES: No cyanosis, clubbing or edema, palpable pulses bilaterally   MUSCULOSKELETAL: No  joint swelling or tenderness, no chest wall tenderness  GASTROINTESTINAL:  soft, non-tender, no bruit    Data Review:    CBC:   Recent Labs     04/30/21  1549 05/01/21  0426 05/02/21  0438   WBC 8.3 6.9 8.0   HGB 12.9 11.6* 12.0   HCT 39.7 35.4* 35.9*   MCV 97.7 97.6 98.2   PLT 244 202 200     BMP:   Recent Labs     04/30/21  1549 05/01/21  0426 05/02/21  0438   NA 144 143 143   K 3.5 3.9 4.2   CL 107 110 111*   CO2 27 25 25    BUN 13 13 15    CREATININE 0.8 0.9 0.9   MG 2.30  --   --      LFTS:   Recent Labs     04/30/21  1549   ALT 15   AST 17   ALKPHOS 96   PROT 7.0   AGRATIO 1.6   BILITOT 0.5     Cardiac Enzymes:   Recent Labs     04/30/21  1945 04/30/21  2354 05/01/21  0426   TROPONINI <0.01 <0.01 <0.01     05/02/21, DO, [facility]  Interventional Cardiology     o: [phone]  [address], [address]  [place], [address] [place]      NOTE:  This report was transcribed using voice recognition software.  Every effort was made to ensure accuracy; however, inadvertent computerized transcription errors may be present.

## 2021-05-09 ENCOUNTER — Ambulatory Visit: Admit: 2021-05-09 | Discharge: 2021-05-09 | Payer: BLUE CROSS/BLUE SHIELD | Attending: Adult Health | Primary: Family

## 2021-05-09 DIAGNOSIS — I2510 Atherosclerotic heart disease of native coronary artery without angina pectoris: Secondary | ICD-10-CM

## 2021-05-09 MED ORDER — RANOLAZINE ER 500 MG PO TB12
500 MG | ORAL_TABLET | Freq: Two times a day (BID) | ORAL | 3 refills | Status: AC
Start: 2021-05-09 — End: 2021-08-13

## 2021-05-09 NOTE — Patient Instructions (Addendum)
Begin Ranexa 500 mg twice a day since you can't take Imdur / isosorbide    Keep appt with Dr. Artis Flock to "enjoy" your vacation

## 2021-05-09 NOTE — Progress Notes (Signed)
[facility]     Outpatient Follow Up Note    CHIEF COMPLAINT / HPI: Hospital Follow Up secondary to status post coronary angiogram    Hospital record has been reviewed  Hospital Course progressed as follows per discharge summary:     Yvonne King is a 56 y.o. female with a past medical history of hypertension, GERD, migraines who presented to the ER with midsternal CP with radiation to neck and jaw.  Associated with SOB.    Trop negative.   abnormal stress test,    abnormal CTA.showing disease in the LAD region.    S/p LHC 3/16 showed mild nonobstructive disease and risk factor reduction plus medical therapy recommended by cards.  She was cleared for discharge.      Assessment and Plan:  Chest Pain/CAD              - Admitted with sternal chest pain             - Abnormal CTA and s/p LHC with mild to moderate non-obstructive CAD             - Feels good post cath, Imdur added,Suspect component of vasospastic angina  Tobacco Abuse Disorder             - Recommended complete cessation  Hypertension             - Controlled.  Continue current medical therapy  GERD             - Continue Protonix     Discharge recommendations given to patient.  Follow Up. PCP in 1 week, cardiology 2 weeks     Yvonne King is 56 y.o. female who presents today for a routine follow up after a recent hospitalization related to the above mentioned issues. She recalls having had pain in the center of her chest with left jaw radiation. Her head felt like it'd explode / pressure. She had just finished lunch.   Hindsight, she had a nuisance HA for three days PTA.  Subjective:   Since the time of discharge, the patient admits their symptoms have improved.    She could not tolerate imdur d/t HAs unrelieved with tylenol or ibuprofen. She already takes norvasc for BP; last checked 135/70     She denies significant chest pain. However she's been having discomfort on her left side ; very local and an ache. She can't say what causes  it; it doesn't last long. Its tender when she pushes on it. Its not similar to what her reflux causes. There is no SOB/DOE. The patient is not experiencing palpitations.     These symptoms are improving over the last many days.   With regard to medication therapy the patient has been compliant with prescribed regimen. They have tolerated therapy to date.     Past Medical History:   Diagnosis Date    Endometriosis     GERD (gastroesophageal reflux disease)     HTN (hypertension)     Migraine      Social History:    Social History     Tobacco Use   Smoking Status Every Day    Packs/day: 1.00    Types: Cigarettes   Smokeless Tobacco Not on file     Current Medications:  Current Outpatient Medications   Medication Sig Dispense Refill    isosorbide mononitrate (IMDUR) 30 MG extended release tablet Take 1 tablet by mouth daily 30 tablet 2  sucralfate (CARAFATE) 1 GM tablet Take 1 tablet by mouth 3 times daily as needed (GERD) 15 tablet 0    atorvastatin (LIPITOR) 40 MG tablet Take 1 tablet by mouth at bedtime 30 tablet 3    lisinopril (PRINIVIL;ZESTRIL) 40 MG tablet Take 1 tablet by mouth daily 30 tablet 3    pantoprazole (PROTONIX) 40 MG tablet Take by mouth 2 times daily      vitamin D (CHOLECALCIFEROL) 50000 UNIT CAPS Take 1 capsule by mouth once WEEKLY for 6 months, then continue OTC VitD3 2000IU once daily      amLODIPine (NORVASC) 5 MG tablet Take 5 mg by mouth daily      nitrofurantoin, macrocrystal-monohydrate, (MACROBID) 100 MG capsule Take 100 mg by mouth 2 times daily Take for 10 days. Start date 3/13      aspirin 81 MG tablet Take 81 mg by mouth daily       No current facility-administered medications for this visit.     REVIEW OF SYSTEMS:   CONSTITUTIONAL: No major weight gain or loss, fatigue, weakness, night sweats or fever. There's been no change in energy level, sleep pattern, or activity level.     HEENT: No new vision difficulties or ringing in the ears.  RESPIRATORY: No new SOB, PND, orthopnea or  cough.   CARDIOVASCULAR: See HPI  GI: No nausea, vomiting, diarrhea, constipation, abdominal pain or changes in bowel habits.  GU: No urinary frequency, urgency, incontinence hematuria or dysuria.  SKIN: No cyanosis or skin lesions.  MUSCULOSKELETAL: No new muscle or joint pain.  NEUROLOGICAL: No syncope or TIA-like symptoms.  PSYCHIATRIC: No anxiety, pain, insomnia or depression    Objective:   PHYSICAL EXAM:        Vitals:    05/09/21 1344 05/09/21 1406   BP: 120/64 132/70   Site: Left Upper Arm Right Upper Arm   Position: Sitting    Cuff Size: Medium Adult    Pulse: 69    SpO2: 100%    Weight: 150 lb 3.2 oz (68.1 kg)    Height: 5' 1.5" (1.562 m)        VITALS:  BP 120/64 (Site: Left Upper Arm, Position: Sitting, Cuff Size: Medium Adult)    Pulse 69    Ht 5' 1.5" (1.562 m)    Wt 150 lb 3.2 oz (68.1 kg)    LMP 03/17/2008    SpO2 100%    BMI 27.92 kg/m??     CONSTITUTIONAL: Cooperative, no apparent distress, and appears well nourished / developed  NEUROLOGIC:  Awake and orientated to person, place and time.  PSYCH: Calm affect.  SKIN: Warm and dry: rt radial unremarkable.  HEENT: Sclera non-icteric, normocephalic, neck supple, no elevation of JVP, normal carotid pulses with no bruits and thyroid normal size.  LUNGS:  No increased work of breathing and clear to auscultation, no crackles or wheezing.  CARDIOVASCULAR:  Regular rate 68and rhythm with no murmurs, gallops, rubs, or abnormal heart sounds, normal PMI. The apical impulses not displaced.                               Heart tones are crisp and normal  Cervical veins are not engorged                 JVP less than 8 cm H2O                                                                              The carotid upstroke is normal in amplitude and contour without delay or bruit    ABDOMEN:  Normal bowel sounds, non-distended and non-tender to palpation   EXT: No edema, no calf  tenderness. Pulses are present bilaterally.    DATA:    Lab Results   Component Value Date    ALT 15 04/30/2021    AST 17 04/30/2021    ALKPHOS 96 04/30/2021    BILITOT 0.5 04/30/2021     Lab Results   Component Value Date    CREATININE 0.9 05/02/2021    BUN 15 05/02/2021    NA 143 05/02/2021    K 4.2 05/02/2021    CL 111 (H) 05/02/2021    CO2 25 05/02/2021     No results found for: TSH, T3TOTAL, T4TOTAL, THYROIDAB  Lab Results   Component Value Date    WBC 8.0 05/02/2021    HGB 12.0 05/02/2021    HCT 35.9 (L) 05/02/2021    MCV 98.2 05/02/2021    PLT 200 05/02/2021     No components found for: CHLPL  Lab Results   Component Value Date    TRIG 62 05/01/2021     Lab Results   Component Value Date    HDL 44 05/01/2021     Lab Results   Component Value Date    LDLCALC 71 05/01/2021     Lab Results   Component Value Date    LABVLDL 12 05/01/2021     Radiology Review:  Pertinent images / reports were reviewed as a part of this visit and reveals the following:    CTA cardiac 05/01/21:     Impression   Scattered calcified and noncalcified plaque is seen throughout the coronary   arteries with scattered areas of mild luminal narrowing.  No flow limiting   stenosis noted with the reservation that the study is mildly limited by phase   misregistration artifact.       Punctate pulmonary nodule right middle lobe.  Please see follow-up   recommendations below       Calcium score 332.  This is in the 90th percentile for patient age        Angiogram: 05/02/21  Dominance: Right      LM: normal   LAD: 40% ostial, 30% mid stenoses  LCx: 20% mid lesion   RCA: 50% mid stenosis      LVEDP: 14 mmHg  LVEF: 60-65%    6 Fr XB 3.0 Guide  Proximal LAD  iFR=0.99  FFR=0.97      6 Fr JR4 Guide  Mid RCA  iFR=0.99  FFR=0.91      Impression/Recommendations:  Mild to moderate nonobstructive CAD, proximal LAD and mid RCA stenoses deferred by FFR evaluations.  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  ASA 81 mg, increased dose Atorvastatin, home  Amlodipine.   Add Imdur.    Assessment:  Diagnosis Orders   1. Coronary artery disease involving native coronary artery of native heart without angina pectoris   ~improved : has not had a recurrence of chest discomfort (jaw radiation was concern with last event as previous episode there was on radiation)  ~intolerant to nitrate d/t HAs ; taking amlodipine for BP control (acceptable anginal)  ~non-obst cor disease by cath ; suspected vasospastic component ; normal LVF  ~ASA / statin / CCB  ~RF : tobacco abuse       2. Mixed hyperlipidemia   ~well controlled on atorvastatin        3. Primary hypertension   ~controlled on lisinopril / amlodipine           Patient  is stable since hospital discharge.    Plan:  Begin Ranexa 500 mg bid for vasospastic angina   F/U as scheduled in June with Dr. Joyce Gross      I have addressed the patient's cardiac risk factors and adjusted pharmacologic treatment as needed. In addition, I have reinforced the need for patient directed risk factor modification.    Further evaluation will be based upon the patient's clinical course and testing results.    All questions and concerns were addressed to the patient. Alternatives to  treatment were discussed.     The patient  currently  is smoking : 1 - 1.5 ppd to ~ 1/2 ppd. The risks related to smoking were reviewed with the patient. Recommend maintaining a smoke-free lifestyle.      Antiplatelet therapy has been recommended / prescribed for this patient. Education conducted on adverse reactions including bleeding was discussed. The patient verbalizes understanding.    Pt is not on a BB : neg MI  Pt is on an ace-i/ARB  Pt is on a statin      Saturated fat diet discussed  Exercise program discussed : no routine. Up/down at work + 3 grandkids at home    Thank you for allowing to [place] to participate in the care of SHAE HINNENKAMP.      [facility]  Documentation of today's visit sent to PCP

## 2021-05-15 ENCOUNTER — Encounter: Payer: BLUE CROSS/BLUE SHIELD | Attending: Family | Primary: Family

## 2021-05-22 NOTE — Telephone Encounter (Signed)
Pt called stating they need a letter for work stating that they were under Dr Justine Null care DOS 3/15-3/17/2023 faxed, when they were in the hospital as soon as possible.      Fax# [phone]

## 2021-05-24 NOTE — Telephone Encounter (Signed)
Letter created. Faxed to number given. Successful transmitting received.

## 2021-07-01 DIAGNOSIS — I2510 Atherosclerotic heart disease of native coronary artery without angina pectoris: Secondary | ICD-10-CM

## 2021-07-24 ENCOUNTER — Ambulatory Visit
Admit: 2021-07-24 | Discharge: 2021-07-24 | Payer: BLUE CROSS/BLUE SHIELD | Attending: Cardiovascular Disease | Primary: Family

## 2021-07-24 DIAGNOSIS — I2510 Atherosclerotic heart disease of native coronary artery without angina pectoris: Secondary | ICD-10-CM

## 2021-07-24 NOTE — Patient Instructions (Addendum)
Increase salt in water in your diet  If you continue having dizziness, try taking 2.5 of amlodipine instead of 5    Continue current medications.    Follow up with Dr Zigmund Daniel or Laverda Sorenson in 12 months   Call for any questions or concerns.

## 2021-07-24 NOTE — Progress Notes (Signed)
[facility]      CONSULTATION  [phone]  07/24/21  Referring: Dr.  Lyman Bishop, APRN - CNP (PCP)    REASON FOR CONSULT/CHIEF COMPLAINT/HPI     Reason for visit/ Chief complaint  Chest pain / vasospastic angina   HPI CAMRIN LAPRE is a 56 y.o. new female patient seen today for hospital follow up. I saw as a consult in the hospital in March.    At the time she had very typical angina and was found to have an abnormal coronary CTA, and she was cathed, which showed moderate lesions, and concerns about a vasospastic component.    I started her on Imdur, which caused severe headaches, so she was instead changed to amlodipine and Ranexa.  She has had complete relief of symptoms, although she has occasional twinges of chest pain if she forgets to take her Ranexa for more than a day or two.    She has had a lot of dizziness lately, mostly when she bends over to tie her shoes, or gets up too fast.  She saw a cardiologist for this several years ago who dx her with orthostatic hypotension and prescribed her compression stockings.    She smokes, has HDL, and has a mother (who is with her), who had an MI in her early 66s.      Patient is adherent with medications and is tolerating them well without side effects     HISTORY/ALLERGIES/ROS     MedHx:  has a past medical history of Endometriosis, GERD (gastroesophageal reflux disease), HTN (hypertension), and Migraine.  SurgHx:  has a past surgical history that includes Hysterectomy; Cesarean section; Appendectomy; pelvic laparoscopy; cyst removal (Right, 07/29/12); and Wrist ganglion excision (07/29/12).   SocHx:  reports that she has been smoking cigarettes. She has been smoking an average of 1 pack per day. She does not have any smokeless tobacco history on file. She reports current alcohol use.   FamHx:   Family History   Problem Relation Age of Onset    Cancer Sister         breast    Heart Failure Mother         mi     Hypertension Father       Allergies: Imitrex [sumatriptan], Imdur [isosorbide nitrate], and Sulfa antibiotics     MEDICATIONS      Prior to Admission medications    Medication Sig Start Date End Date Taking? Authorizing Provider   ranolazine (RANEXA) 500 MG extended release tablet Take 1 tablet by mouth 2 times daily 05/09/21  Yes Clinton Quant, APRN - CNP   atorvastatin (LIPITOR) 40 MG tablet Take 1 tablet by mouth at bedtime 05/02/21  Yes Bettye Boeck, APRN - CNP   lisinopril (PRINIVIL;ZESTRIL) 40 MG tablet Take 1 tablet by mouth daily 05/02/21  Yes Bettye Boeck, APRN - CNP   pantoprazole (PROTONIX) 40 MG tablet Take by mouth 2 times daily 02/13/20  Yes Historical Provider, MD   vitamin D (CHOLECALCIFEROL) 50000 UNIT CAPS Take 1 capsule by mouth once WEEKLY for 6 months, then continue OTC VitD3 2000IU once daily 03/28/21  Yes Historical Provider, MD   amLODIPine (NORVASC) 5 MG tablet Take 1 tablet by mouth daily   Yes Historical Provider, MD   aspirin 81 MG tablet Take 1 tablet by mouth daily   Yes Severiano Gilbert, DO   sucralfate (CARAFATE) 1 GM tablet Take 1 tablet by mouth 3 times daily as needed (GERD)  Patient  not taking: Reported on 05/09/2021 05/02/21   Bettye Boeck, APRN - CNP     PHYSICAL EXAM        Vitals:    07/24/21 1153   BP: 114/70   Pulse: 75   SpO2: 99%    Weight - Scale: 150 lb 6.4 oz (68.2 kg)     Gen Alert, cooperative, no distress Heart  Regular rate and rhythm, no murmur.  Normal S1 and S2.  Warm/well perfused.  No JVD.  No carotid bruit.   HEENT Normocephalic, atraumatic.  Conj/corn clear  Lips, mucosa, tongue normal Abd  Soft, nontender, nondistended, no organomegaly   Neck Supple, trachea midline Ext  Ext nl, AT, no C/C, no edema   Lungs Lungs clear to auscultation bilaterally, unlabored breathing Pulse 2+ and symmetric   Chest wall No deformity Skin Color/text/turg nl, no rash/lesions   Neuro No gross deficits Psych Nl mood and affect     LABS and Imaging     Relevant and available CV data reviewed    EKG  personally interpreted:   Results for orders placed or performed during the hospital encounter of 04/30/21   EKG 12 Lead   Result Value Ref Range    Ventricular Rate 77 BPM    Atrial Rate 77 BPM    P-R Interval 146 ms    QRS Duration 90 ms    Q-T Interval 414 ms    QTc Calculation (Bazett) 468 ms    P Axis 30 degrees    R Axis 50 degrees    T Axis 33 degrees    Diagnosis       Normal sinus rhythmNormal ECGConfirmed by Alden Server [id number]) on 05/01/2021 11:13:04 AM     Lab Results   Component Value Date    CHOL 127 05/01/2021    TRIG 62 05/01/2021    HDL 44 05/01/2021    LDLCALC 71 05/01/2021    LABVLDL 12 05/01/2021     The ASCVD Risk score (Arnett DK, et al., 2019) failed to calculate for the following reasons:    The valid total cholesterol range is 130 to 320 mg/dL    Lab Results   Component Value Date    PROBNP 105 04/30/2021     No results found for: [facility]  Lab Results   Component Value Date    TROPONINI <0.01 05/01/2021    TROPONINI <0.01 04/30/2021    TROPONINI <0.01 04/30/2021     Hemoglobin A1C   Date Value Ref Range Status   05/01/2021 5.7 See comment % Final     Comment:     Comment:  Diagnosis of Diabetes: > or = 6.5%  Increased risk of diabetes (Prediabetes): 5.7-6.4%  Glycemic Control: Nonpregnant Adults: <7.0%                    Pregnant: <6.0%          No results found for: TSHFT4, TSH, TSHREFLEX, TSH3GEN  Lab Results   Component Value Date    WBC 8.0 05/02/2021    HGB 12.0 05/02/2021    HCT 35.9 (L) 05/02/2021    MCV 98.2 05/02/2021    PLT 200 05/02/2021     Lab Results   Component Value Date    NA 143 05/02/2021    K 4.2 05/02/2021    CL 111 (H) 05/02/2021    CO2 25 05/02/2021    BUN 15 05/02/2021    CREATININE 0.9 05/02/2021    GLUCOSE  86 05/02/2021    CALCIUM 9.2 05/02/2021    PROT 7.0 04/30/2021    LABALBU 4.3 04/30/2021    BILITOT 0.5 04/30/2021    ALKPHOS 96 04/30/2021    AST 17 04/30/2021    ALT 15 04/30/2021    LABGLOM >60 05/02/2021    GFRAA >60 07/16/2012    AGRATIO 1.6 04/30/2021      CARDIAC IMAGING/TESTING:    Echo:   No results found for this or any previous visit.    TEE:  No results found for this or any previous visit.    Cardiac MRI  No results found for this or any previous visit.    Stress:  NM MYOCARDIAL SPECT REST EXERCISE OR RX 05/01/2021 10:47 AM, 05/01/2021 12:00 AM (Final)    Indications: Chest pain.    Summary  The overall quality of the study is good. There is significant breast and  subdiaphragmatic attenuation.    Left ventricular cavity size is normal. Right ventricle is normal in size.    Spect imaging demonstrates a small area of mildly reduced perfusion in the  apex, apical anterior and apical septum. The defect is mixed ischemia and  scar. There is associated wall motion abnormality.    Sum stress score of 5. No visual TID. Calculated TID of 0.96.    Left ventricular ejection fraction is normal at 74%.    **Myocardial perfusion is abnormal. Mixed ischemia and scar. Low-moderate  risk scan. Hypertensive response to exercise**    Recommendation  Cardiology consultation. Consider coronary CTA    ECG Findings  Sinus tachycardia.    Arrhythmias  No significant rhythm abnormality.    Symptoms  There was stress induced lightheadedness and sinus pressure.  Symptoms resolved with rest.  Denied chest pain/discomfort during the test.    Complications  Procedure complication was none.    Stress Interpretation  No ischemic EKG Changes.  Less than 0.5 mm upsloping ST deviation with exercise.  Hypertensive response to exercise.       ISCHEMIC EVALUATION / CATH RESULTS  [facility]   [facility]  Dominance: Right      LM: normal   LAD: 40% ostial, 30% mid stenoses  LCx: 20% mid lesion   RCA: 50% mid stenosis      LVEDP: 14 mmHg  LVEF: 60-65%     Impression/Recommendations:  Mild to moderate nonobstructive CAD, proximal LAD and mid RCA stenoses deferred by FFR evaluations.  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  ASA 81 mg, increased dose Atorvastatin, home  Amlodipine.   Add Imdur.  OK to DC home today with close outpatient follow up.      Calcium score   No results found for this or any previous visit.    Coronary CTA   CTA CARDIAC W C STRC MORP W CONTRAST 05/01/2021  7:11 PM (Final)    Reason for Exam: abnormal stress test, evaluate for coronary disease    FINDINGS:  Coronary arteries    Calcium score is 332.  The score of the LAD is 80.  The score of the  circumflex is 65.  The score of the right coronary artery is 187    Right:    Right coronary artery originates from the right coronary artery cusp.  Scattered areas of calcified and noncalcified plaque are seen throughout the  proximal and midportion of the right coronary artery with scattered areas  mild luminal narrowing.  No flow limiting stenosis noted.  Left :    Left coronary artery originates from the left coronary artery cusp    Left main coronary artery is patent.    Scattered calcified and noncalcified plaque is seen through the circumflex  coronary artery.  Circumflex coronary artery gives rise to patent obtuse  marginal branches.  No flow limiting stenosis noted with the reservation that  phase misregistration artifact mildly degrades images of the circumflex    Scattered calcified and noncalcified plaque is seen throughout the left  anterior descending coronary artery with scattered areas of mild luminal  narrowing.  Left anterior descending coronary artery gives rise to patent  diagonal branches.  No limiting stenosis    Mediastinum: Mild atherosclerotic changes seen in the ascending and  descending thoracic aorta...  Ascending aorta measures 2.8 cm.  No intimal  flap. Trace aortic annulus calcification is seen.  No mitral valve  calcification is seen.  No pericardial effusion.  No pericardial  calcification noted.  No significant mediastinal or hilar adenopathy.    Lungs/Pleura: No focal consolidation is seen on limited images of the lungs    Punctate noncalcified pulmonary nodule seen in the right  middle lobe, near  the minor fissure measuring 3 mm.    Upper Abdomen: Limited images of the upper abdomen are unremarkable.    Soft Tissues/Bones: Spurring is seen spine    Impression  Scattered calcified and noncalcified plaque is seen throughout the coronary  arteries with scattered areas of mild luminal narrowing.  No flow limiting  stenosis noted with the reservation that the study is mildly limited by phase  misregistration artifact.    Punctate pulmonary nodule right middle lobe.  Please see follow-up  recommendations below    Calcium score 332.  This is in the 90th percentile for patient age    CARDIAC RHYTHM ASSESSMENT:    Holter/Event monitor  No results found for this or any previous visit.    EP studies / cardioversions   No results found for this or any previous visit.    ModerateRisk  ModerateComplexity/Medical Decision Making    Outside/Care everywhere records Reviewed  Labs Reviewed  Prior Imaging, ekg, cath, echo reviewed when available  Medications reviewed  Old Notes reviewed  ASSESSMENT AND PLAN     Encounter Diagnoses   Name Primary?    Prinzmetal's angina (HCC)     Coronary artery disease involving native coronary artery of native heart without angina pectoris Yes    Mixed hyperlipidemia     Primary hypertension     Dizziness      Overall she is doing well, with good relief of her vasospastic angina with amlodipine and Ranexa.  Her dizziness sounds orthostatic; I advised her to increase salt and water intake, and to consider reducing amlodipine to 2.5 mg if still getting dizzy, given a lowish blood pressure in office today.    F/u 1 year    All new cardiac testing and lab results personally reviewed by me during this office visit and discussed with patient.    Patient counseled on lifestyle modification, diet, and exercise.  No medication changes today   Continue risk factor modifications   Call for any new or worsening symptoms.   Follow Up: 1 year, sooner if needed     Uvaldo Rising,  MD  Cardiologist [facility]

## 2021-08-13 MED ORDER — RANOLAZINE ER 500 MG PO TB12
500 MG | ORAL_TABLET | Freq: Two times a day (BID) | ORAL | 3 refills | Status: AC
Start: 2021-08-13 — End: 2022-08-06

## 2021-08-13 NOTE — Telephone Encounter (Signed)
Last OV: 07/24/21 [facility]  Next OV: on recall list   Last labs: BMP and CBC 05/02/21  Last EKG: 04/30/21  Last filled:   Disp Refills Start End    ranolazine (RANEXA) 500 MG extended release tablet 60 tablet 3 05/09/2021     Sig - Route: Take 1 tablet by mouth 2 times daily - Oral    Sent to pharmacy as: Ranolazine ER 500 MG Oral Tablet Extended Release 12 Hour    E-Prescribing Status: Receipt confirmed by pharmacy (05/09/2021  2:08 PM EDT)

## 2021-10-18 NOTE — Telephone Encounter (Signed)
Last OV: 07/24/21 [facility]   Next OV: on recall list   Last labs: CBC and BMP 05/02/21  Last EKG: 04/30/21  Last filled:      Disp Refills Start End    atorvastatin (LIPITOR) 40 MG tablet 30 tablet 3 05/02/2021     Sig - Route: Take 1 tablet by mouth at bedtime - Oral    Class: Print       Disp Refills Start End    lisinopril (PRINIVIL;ZESTRIL) 40 MG tablet 30 tablet 3 05/02/2021     Sig - Route: Take 1 tablet by mouth daily - Oral    Class: Print       Disp Refills Start End    pantoprazole (PROTONIX) 40 MG tablet   02/13/2020     Sig - Route: Take by mouth 2 times daily - Oral    Class: Historical Med       Disp Refills Start End    amLODIPine (NORVASC) 5 MG tablet        Sig - Route: Take 1 tablet by mouth daily - Oral    Class: Historical Med

## 2021-10-18 NOTE — Telephone Encounter (Signed)
Medication Refill    Medication needing refilled:  atorvastatin (LIPITOR    Dosage of the medication:  40 MG     How are you taking this medication (QD, BID, TID, QID, PRN):    30 or 90 day supply called in:  30  When will you run out of your medication:    Which Pharmacy are we sending the medication to?:  [facility] [address] [place] - [address] - P [phone] Carmon Ginsberg [phone]   [address] [place] [address]   Phone:  [phone]  Fax:  [phone]        Medication Refill    Medication needing refilled:  amLODIPine (NORVASC    Dosage of the medication:  : 5 mg  How are you taking this medication (QD, BID, TID, QID, PRN):    30 or 90 day supply called in:    When will you run out of your medication:    Which Pharmacy are we sending the medication to?:    [facility] [address], [place] - [address] - P [phone] Carmon Ginsberg [phone]   [address], [place] [address]   Phone:  [phone]  Fax:  [phone]        Medication Refill    Medication needing refilled:  pantoprazole (PROTONIX)    Dosage of the medication:  40 MG     How are you taking this medication (QD, BID, TID, QID, PRN):    30 or 90 day supply called in:    When will you run out of your medication:    Which Pharmacy are we sending the medication to?:    [facility] [address] [place] - [address] - P [phone] Carmon Ginsberg [phone]   [address] [place] [address]   Phone:  [phone]  Fax:  [phone]      Medication Refill    Medicationlisinopril (PRINIVIL;ZESTRIL   needing refilled:    Dosage of the medication:  40 MG     How are you taking this medication (QD, BID, TID, QID, PRN):    30 or 90 day supply called in:  30  When will you run out of your medication:    Which Pharmacy are we sending the medication to?:  Pharmacy    [facility] [address], [place] - [address] - Demetrius Charity [phone] Carmon Ginsberg [phone]   [address] [place] [address]   Phone:   [phone]  Fax:  [phone]

## 2021-10-22 MED ORDER — ATORVASTATIN CALCIUM 40 MG PO TABS
40 MG | ORAL_TABLET | Freq: Every evening | ORAL | 3 refills | Status: AC
Start: 2021-10-22 — End: 2022-07-25

## 2021-10-22 MED ORDER — PANTOPRAZOLE SODIUM 40 MG PO TBEC
40 MG | ORAL_TABLET | Freq: Two times a day (BID) | ORAL | 3 refills | Status: AC
Start: 2021-10-22 — End: ?

## 2021-10-22 MED ORDER — AMLODIPINE BESYLATE 5 MG PO TABS
5 MG | ORAL_TABLET | Freq: Every day | ORAL | 3 refills | Status: AC
Start: 2021-10-22 — End: ?

## 2021-10-22 MED ORDER — LISINOPRIL 40 MG PO TABS
40 MG | ORAL_TABLET | Freq: Every day | ORAL | 3 refills | Status: AC
Start: 2021-10-22 — End: ?

## 2022-07-18 NOTE — Telephone Encounter (Signed)
Formatting of this note might be different from the original.  Results received from [facility] with Pt and advised her of results   Electronically signed by Barbara Cower at 07/18/2022  1:23 PM EDT

## 2022-07-25 ENCOUNTER — Ambulatory Visit
Admit: 2022-07-25 | Discharge: 2022-07-25 | Payer: BLUE CROSS/BLUE SHIELD | Attending: Cardiovascular Disease | Primary: Family

## 2022-07-25 DIAGNOSIS — I2510 Atherosclerotic heart disease of native coronary artery without angina pectoris: Secondary | ICD-10-CM

## 2022-07-25 MED ORDER — ATORVASTATIN CALCIUM 80 MG PO TABS
80 MG | ORAL_TABLET | Freq: Every evening | ORAL | 3 refills | Status: AC
Start: 2022-07-25 — End: ?

## 2022-07-25 NOTE — Progress Notes (Signed)
[facility]      CARDIAC CONSULTATION  [phone]  07/25/22  Referring: Dr.  Lyman Bishop, APRN - CNP (PCP)    REASON FOR CONSULT/CHIEF COMPLAINT/HPI     Reason for visit/ Chief complaint  Chest pain / vasospastic angina  Chief Complaint   Patient presents with    Follow-up     Patient complains of BP fluctuating up and down.      Hypertension    Hyperlipidemia    Coronary Artery Disease      HPI Yvonne King is a 57 y.o. woman with nonobstructive CAD and coronary vasospasm.  I initially met her in-hospital in 04/2021 at which time she had a cath demonstrating 3-vessel plaque including 50% stenosis in her RCA.  No intervention was needed.    I started her on Imdur, which caused severe headaches, so she was instead changed to amlodipine and Ranexa.  She has had complete relief of symptoms, although she has occasional twinges of chest pain if she forgets to take her Ranexa for more than a day or two.    She smokes, but has cut down significantly.  She has HLP and premature FH of MI in her mother, who had an MI in her early 47s.      Recent labs thru her work program. LDL 89.    Today, she is here for one year follow up. She has occasional twinges left lateral chest area below ribs. She denies DOE, palpitations, light-headedness. Occ low blood pressures, not new for her. She works for a [employer].    Patient is adherent with medications and is tolerating them well without side effects     HISTORY/ALLERGIES/ROS     MedHx:  has a past medical history of Endometriosis, GERD (gastroesophageal reflux disease), HTN (hypertension), and Migraine.  SurgHx:  has a past surgical history that includes Hysterectomy; Cesarean section; Appendectomy; pelvic laparoscopy; cyst removal (Right, 07/29/12); and Wrist ganglion excision (07/29/12).   SocHx:  reports that she has been smoking cigarettes. She does not have any smokeless tobacco history on file. She reports current alcohol use.   FamHx:   Family  History   Problem Relation Age of Onset    Cancer Sister         breast    Heart Failure Mother         mi     Hypertension Father      Allergies: Imitrex [sumatriptan], Imdur [isosorbide nitrate], and Sulfa antibiotics     MEDICATIONS      Prior to Admission medications    Medication Sig Start Date End Date Taking? Authorizing Provider   atorvastatin (LIPITOR) 80 MG tablet Take 1 tablet by mouth at bedtime 07/25/22  Yes Uvaldo Rising, MD   lisinopril (PRINIVIL;ZESTRIL) 40 MG tablet Take 1 tablet by mouth daily 10/22/21  Yes Uvaldo Rising, MD   pantoprazole (PROTONIX) 40 MG tablet Take 1 tablet by mouth 2 times daily 10/22/21  Yes Uvaldo Rising, MD   amLODIPine [name]) 5 MG tablet Take 1 tablet by mouth daily 10/22/21  Yes Uvaldo Rising, MD   ranolazine (RANEXA) 500 MG extended release tablet Take 1 tablet by mouth 2 times daily 08/13/21  Yes Uvaldo Rising, MD   aspirin 81 MG tablet Take 1 tablet by mouth daily   Yes Albina Billet L, DO   sucralfate (CARAFATE) 1 GM tablet Take 1 tablet by mouth 3 times daily as needed (GERD)  Patient not  taking: Reported on 05/09/2021 05/02/21   Bettye Boeck, APRN - CNP   vitamin D (CHOLECALCIFEROL) 50000 UNIT CAPS Take 1 capsule by mouth once WEEKLY for 6 months, then continue OTC VitD3 2000IU once daily  Patient not taking: Reported on 07/25/2022 03/28/21   [provider]     PHYSICAL EXAM        Vitals:    07/25/22 1540   BP: 112/72   Pulse: 81   SpO2: 97%      Weight - Scale: 70.6 kg (155 lb 9.6 oz)     Gen Alert, cooperative, no distress Heart  Regular rate and rhythm, no murmur.  Normal S1 and S2.  Warm/well perfused.  No JVD.  No carotid bruit.   HEENT Normocephalic, atraumatic.  Conj/corn clear  Lips, mucosa, tongue normal Abd  Soft, nontender, nondistended, no organomegaly   Neck Supple, trachea midline Ext  Ext nl, AT, no C/C, no edema   Lungs Lungs clear to auscultation bilaterally, unlabored breathing Pulse 2+ and symmetric   Chest wall No deformity  Skin Color/text/turg nl, no rash/lesions   Neuro No gross deficits Psych Nl mood and affect     LABS and Imaging     Relevant and available CV data reviewed    EKG personally interpreted 07/25/22> NSR 73  Results for orders placed or performed during the hospital encounter of 04/30/21   EKG 12 Lead   Result Value Ref Range    Ventricular Rate 77 BPM    Atrial Rate 77 BPM    P-R Interval 146 ms    QRS Duration 90 ms    Q-T Interval 414 ms    QTc Calculation (Bazett) 468 ms    P Axis 30 degrees    R Axis 50 degrees    T Axis 33 degrees    Diagnosis       Normal sinus rhythmNormal ECGConfirmed by Alden Server [phone]) on 05/01/2021 11:13:04 AM     Lab Results   Component Value Date    CHOL 127 05/01/2021    TRIG 62 05/01/2021    HDL 44 05/01/2021     Hemoglobin A1C   Date Value Ref Range Status   05/01/2021 5.7 See comment % Final     Comment:     Comment:  Diagnosis of Diabetes: > or = 6.5%  Increased risk of diabetes (Prediabetes): 5.7-6.4%  Glycemic Control: Nonpregnant Adults: <7.0%                    Pregnant: <6.0%          No results found for: "TSHFT4", "TSH", "TSH3GEN"  05/13/22  Component  Ref Range & Units 2 mo ago   TSH  0.450 - 4.500 uIU/mL 1.420     Component  Ref Range & Units 05/13/22   Glucose  70 - 99 mg/dL 90   Uric Acid  3.0 - 7.2 mg/dL 3.6   BUN  6 - 24 mg/dL 12   Creatinine  2.95 - 1.00 mg/dL 1.88   eGFR  >41 YS/AYT/0.16 69   BUN/Creatinine Ratio  9 - 23 13   Sodium  134 - 144 mmol/L 140   Potassium  3.5 - 5.2 mmol/L 4.4   Chloride  96 - 106 mmol/L 102   Calcium  8.7 - 10.2 mg/dL 9.5   Phosphorus  3.0 - 4.3 mg/dL 3.5   Total Protein  6.0 - 8.5 g/dL 6.7   Albumin  3.8 - 4.9  g/dL 4.5   Globulin, Total  1.5 - 4.5 g/dL 2.2   A/G Ratio  1.2 - 2.2 2.0   Total Bilirubin  0.0 - 1.2 mg/dL 0.5   Alkaline Phosphatase  44 - 121 IU/L 78   LD  119 - 226 IU/L 172   AST  0 - 40 IU/L 13   ALT (SGPT)  0 - 32 IU/L 20   GGT  0 - 60 IU/L 9   Iron  27 - 159 ug/dL 161   Cholesterol  096 - 199 mg/dL 045   Triglycerides  0 - 149  mg/dL 409   HDL  >81 mg/dL 58   VLDL Cholesterol Cal  5 - 40 mg/dL 25   LDL Calculated  0 - 99 mg/dL 89   Chol/HDL Ratio  0.0 - 4.4 ratio 3.0       CARDIAC IMAGING/TESTING:    Echo:   No results found for this or any previous visit.    Stress:  NM MYOCARDIAL SPECT REST EXERCISE OR RX 05/01/2021 10:47 AM, 05/01/2021 12:00 AM (Final)  **Myocardial perfusion is abnormal. Mixed ischemia and scar. Low-moderate  risk scan. Hypertensive response to exercise**     ISCHEMIC EVALUATION / CATH RESULTS  Cath:   [facility] 05/02/21   Left Heart Cath  Dominance: Right      LM: normal   LAD: 40% ostial, 30% mid stenoses  LCx: 20% mid lesion   RCA: 50% mid stenosis      LVEDP: 14 mmHg  LVEF: 60-65%     Impression/Recommendations:  Mild to moderate nonobstructive CAD, proximal LAD and mid RCA stenoses deferred by FFR evaluations.  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  ASA 81 mg, increased dose Atorvastatin, home Amlodipine.   Add Imdur.  OK to DC home today with close outpatient follow up.      Coronary CTA   CTA CARDIAC W C STRC MORP W CONTRAST 05/01/2021  7:11 PM (Final)    Calcium score is 332.  The score of the LAD is 80.  The score of the  circumflex is 65.  The score of the right coronary artery is 187    Impression  Scattered calcified and noncalcified plaque is seen throughout the coronary  arteries with scattered areas of mild luminal narrowing.  No flow limiting  stenosis noted with the reservation that the study is mildly limited by phase  misregistration artifact.    Calcium score 332.  This is in the 90th percentile for patient age    CARDIAC RHYTHM ASSESSMENT:    Holter/Event monitor  No results found for this or any previous visit.    EP studies / cardioversions   No results found for this or any previous visit.    Outside/Care everywhere records Reviewed  Labs Reviewed  Prior Imaging, ekg, cath, echo reviewed when available  Medications reviewed  Old Notes reviewed  ASSESSMENT AND PLAN     Nonobstructive CAD - last  LDL 89; increase atorvastatin from 40 to 80.  No angina on current regimen  Coronary vasospasm - continue current regimen of amlodipine and Ranexa  HTN - well-controlled on current regimen.  Continue current medications.  Smoking - patient is working on stopping smoking.  I encouraged her to continue efforts to stop, and advised her that much of her coronary vasospasm and chest pain has been due to her nicotine.    All new cardiac testing and lab results personally reviewed by me during this office visit  and discussed with patient.    Patient counseled on lifestyle modification, diet, and exercise.    Medication changes today - Double aotrvastatin to 80mg  qd (recent LDL 89 -- goal <70)   Continue risk factor modifications   Call for any new or worsening symptoms.     Follow Up: 1 year    W. Berle Mull, MD  [facility]  Noninvasive and Adult Congenital Cardiologist  [phone]  [email or web address]    Scribe's attestation: This note was scribed in the presence of Dr. Lacretia Nicks. Berle Mull, MD, by Oletha Cruel, RN.    Physician Attestation  The scribe wrote this note in the presence of me Uvaldo Rising, MD).  The scribe may have prepopulated components of this note with my historical  intellectual property under my direct supervision.  Any additions to this intellectual property were performed in my presence and at my direction.  Furthermore, the content and accuracy of this note have been reviewed by me with edits by me as needed.  Uvaldo Rising, MD 07/25/2022 4:19 PM

## 2022-07-25 NOTE — Patient Instructions (Signed)
Double atorvastatin to 80mg  every evening.    Call the office for any new, worsening, or concerning symptoms.

## 2022-07-25 NOTE — Progress Notes (Unsigned)
[facility]      CARDIAC CONSULTATION  [phone]  07/24/22  Referring: Dr.  Lyman Bishop, APRN - CNP (PCP)    REASON FOR CONSULT/CHIEF COMPLAINT/HPI     Reason for visit/ Chief complaint  Chest pain / vasospastic angina   HPI Yvonne King is a 57 y.o. new female patient seen today for hospital follow up. I saw as a consult in the hospital in March.    At the time she had very typical angina and was found to have an abnormal coronary CTA, and she was cathed, which showed moderate lesions, and concerns about a vasospastic component.    I started her on Imdur, which caused severe headaches, so she was instead changed to amlodipine and Ranexa.  She has had complete relief of symptoms, although she has occasional twinges of chest pain if she forgets to take her Ranexa for more than a day or two.    She has had a lot of dizziness lately, mostly when she bends over to tie her shoes, or gets up too fast.  She saw a cardiologist for this several years ago who dx her with orthostatic hypotension and prescribed her compression stockings.    She smokes, has HDL, and has a mother (who is with her), who had an MI in her early 72s.      Patient is adherent with medications and is tolerating them well without side effects     HISTORY/ALLERGIES/ROS     MedHx:  has a past medical history of Endometriosis, GERD (gastroesophageal reflux disease), HTN (hypertension), and Migraine.  SurgHx:  has a past surgical history that includes Hysterectomy; Cesarean section; Appendectomy; pelvic laparoscopy; cyst removal (Right, 07/29/12); and Wrist ganglion excision (07/29/12).   SocHx:  reports that she has been smoking cigarettes. She does not have any smokeless tobacco history on file. She reports current alcohol use.   FamHx:   Family History   Problem Relation Age of Onset    Cancer Sister         breast    Heart Failure Mother         mi     Hypertension Father      Allergies: Imitrex [sumatriptan], Imdur [isosorbide  nitrate], and Sulfa antibiotics     MEDICATIONS      Prior to Admission medications    Medication Sig Start Date End Date Taking? Authorizing Provider   atorvastatin (LIPITOR) 40 MG tablet Take 1 tablet by mouth at bedtime 10/22/21   Uvaldo Rising, MD   lisinopril (PRINIVIL;ZESTRIL) 40 MG tablet Take 1 tablet by mouth daily 10/22/21   Uvaldo Rising, MD   pantoprazole (PROTONIX) 40 MG tablet Take 1 tablet by mouth 2 times daily 10/22/21   Uvaldo Rising, MD   amLODIPine [facility]) 5 MG tablet Take 1 tablet by mouth daily 10/22/21   Uvaldo Rising, MD   ranolazine (RANEXA) 500 MG extended release tablet Take 1 tablet by mouth 2 times daily 08/13/21   Uvaldo Rising, MD   sucralfate (CARAFATE) 1 GM tablet Take 1 tablet by mouth 3 times daily as needed (GERD)  Patient not taking: Reported on 05/09/2021 05/02/21   Bettye Boeck, APRN - CNP   vitamin D (CHOLECALCIFEROL) 50000 UNIT CAPS Take 1 capsule by mouth once WEEKLY for 6 months, then continue OTC VitD3 2000IU once daily 03/28/21   [provider]   aspirin 81 MG tablet Take 1 tablet by mouth daily  Albina Billet L, DO     PHYSICAL EXAM        There were no vitals filed for this visit.         Gen Alert, cooperative, no distress Heart  Regular rate and rhythm, no murmur.  Normal S1 and S2.  Warm/well perfused.  No JVD.  No carotid bruit.   HEENT Normocephalic, atraumatic.  Conj/corn clear  Lips, mucosa, tongue normal Abd  Soft, nontender, nondistended, no organomegaly   Neck Supple, trachea midline Ext  Ext nl, AT, no C/C, no edema   Lungs Lungs clear to auscultation bilaterally, unlabored breathing Pulse 2+ and symmetric   Chest wall No deformity Skin Color/text/turg nl, no rash/lesions   Neuro No gross deficits Psych Nl mood and affect     LABS and Imaging     Relevant and available CV data reviewed    EKG personally interpreted:   Results for orders placed or performed during the hospital encounter of 04/30/21   EKG 12 Lead   Result Value Ref  Range    Ventricular Rate 77 BPM    Atrial Rate 77 BPM    P-R Interval 146 ms    QRS Duration 90 ms    Q-T Interval 414 ms    QTc Calculation (Bazett) 468 ms    P Axis 30 degrees    R Axis 50 degrees    T Axis 33 degrees    Diagnosis       Normal sinus rhythmNormal ECGConfirmed by Alden Server [phone]) on 05/01/2021 11:13:04 AM     Lab Results   Component Value Date    CHOL 127 05/01/2021    TRIG 62 05/01/2021    HDL 44 05/01/2021     The ASCVD Risk score (Arnett DK, et al., 2019) failed to calculate for the following reasons:    The valid total cholesterol range is 130 to 320 mg/dL    Lab Results   Component Value Date    PROBNP 105 04/30/2021     No results found for: "TROPHS"  Lab Results   Component Value Date    TROPONINI <0.01 05/01/2021    TROPONINI <0.01 04/30/2021    TROPONINI <0.01 04/30/2021     Hemoglobin A1C   Date Value Ref Range Status   05/01/2021 5.7 See comment % Final     Comment:     Comment:  Diagnosis of Diabetes: > or = 6.5%  Increased risk of diabetes (Prediabetes): 5.7-6.4%  Glycemic Control: Nonpregnant Adults: <7.0%                    Pregnant: <6.0%          No results found for: "TSHFT4", "TSH", "TSH3GEN"  05/13/22  Component  Ref Range & Units 2 mo ago   TSH  0.450 - 4.500 uIU/mL 1.420       Lab Results   Component Value Date    WBC 8.0 05/02/2021    HGB 12.0 05/02/2021    HCT 35.9 (L) 05/02/2021    MCV 98.2 05/02/2021    PLT 200 05/02/2021     Lab Results   Component Value Date    NA 143 05/02/2021    K 4.2 05/02/2021    CL 111 (H) 05/02/2021    CO2 25 05/02/2021    BUN 15 05/02/2021    CREATININE 0.9 05/02/2021    GLUCOSE 86 05/02/2021    CALCIUM 9.2 05/02/2021    BILITOT 0.5 04/30/2021  ALKPHOS 96 04/30/2021    AST 17 04/30/2021    ALT 15 04/30/2021    LABGLOM >60 05/02/2021    GFRAA >60 07/16/2012    AGRATIO 1.6 04/30/2021       Component  Ref Range & Units 05/13/22 Comments   Glucose  70 - 99 mg/dL 90    Uric Acid  3.0 - 7.2 mg/dL 3.6            Therapeutic target for gout patients:  <6.0   BUN  6 - 24 mg/dL 12    Creatinine  8.75 - 1.00 mg/dL 6.43    eGFR  >32 RJ/JOA/4.16 69    BUN/Creatinine Ratio  9 - 23 13    Sodium  134 - 144 mmol/L 140    Potassium  3.5 - 5.2 mmol/L 4.4    Chloride  96 - 106 mmol/L 102    Calcium  8.7 - 10.2 mg/dL 9.5    Phosphorus  3.0 - 4.3 mg/dL 3.5    Total Protein  6.0 - 8.5 g/dL 6.7    Albumin  3.8 - 4.9 g/dL 4.5    Globulin, Total  1.5 - 4.5 g/dL 2.2    A/G Ratio  1.2 - 2.2 2.0    Total Bilirubin  0.0 - 1.2 mg/dL 0.5    Alkaline Phosphatase  44 - 121 IU/L 78    LD  119 - 226 IU/L 172    AST  0 - 40 IU/L 13    ALT (SGPT)  0 - 32 IU/L 20    GGT  0 - 60 IU/L 9    Iron  27 - 159 ug/dL 606    Cholesterol  301 - 199 mg/dL 601    Triglycerides  0 - 149 mg/dL 093    HDL  >23 mg/dL 58    VLDL Cholesterol Cal  5 - 40 mg/dL 25    LDL Calculated  0 - 99 mg/dL 89    Chol/HDL Ratio  0.0 - 4.4 ratio 3.0        CARDIAC IMAGING/TESTING:    Echo:   No results found for this or any previous visit.    TEE:  No results found for this or any previous visit.    Cardiac MRI  No results found for this or any previous visit.    Stress:  NM MYOCARDIAL SPECT REST EXERCISE OR RX 05/01/2021 10:47 AM, 05/01/2021 12:00 AM (Final)    Indications: Chest pain.    Summary  The overall quality of the study is good. There is significant breast and  subdiaphragmatic attenuation.    Left ventricular cavity size is normal. Right ventricle is normal in size.    Spect imaging demonstrates a small area of mildly reduced perfusion in the  apex, apical anterior and apical septum. The defect is mixed ischemia and  scar. There is associated wall motion abnormality.    Sum stress score of 5. No visual TID. Calculated TID of 0.96.    Left ventricular ejection fraction is normal at 74%.    **Myocardial perfusion is abnormal. Mixed ischemia and scar. Low-moderate  risk scan. Hypertensive response to exercise**    Recommendation  Cardiology consultation. Consider coronary CTA    ECG Findings  Sinus  tachycardia.    Arrhythmias  No significant rhythm abnormality.    Symptoms  There was stress induced lightheadedness and sinus pressure.  Symptoms resolved with rest.  Denied chest pain/discomfort during the test.  Complications  Procedure complication was none.    Stress Interpretation  No ischemic EKG Changes.  Less than 0.5 mm upsloping ST deviation with exercise.  Hypertensive response to exercise.       ISCHEMIC EVALUATION / CATH RESULTS  Cath:   [facility] 05/02/21   Left Heart Cath  Dominance: Right      LM: normal   LAD: 40% ostial, 30% mid stenoses  LCx: 20% mid lesion   RCA: 50% mid stenosis      LVEDP: 14 mmHg  LVEF: 60-65%     Impression/Recommendations:  Mild to moderate nonobstructive CAD, proximal LAD and mid RCA stenoses deferred by FFR evaluations.  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  ASA 81 mg, increased dose Atorvastatin, home Amlodipine.   Add Imdur.  OK to DC home today with close outpatient follow up.      Calcium score   No results found for this or any previous visit.    Coronary CTA   CTA CARDIAC W C STRC MORP W CONTRAST 05/01/2021  7:11 PM (Final)    Reason for Exam: abnormal stress test, evaluate for coronary disease    FINDINGS:  Coronary arteries    Calcium score is 332.  The score of the LAD is 80.  The score of the  circumflex is 65.  The score of the right coronary artery is 187    Right:    Right coronary artery originates from the right coronary artery cusp.  Scattered areas of calcified and noncalcified plaque are seen throughout the  proximal and midportion of the right coronary artery with scattered areas  mild luminal narrowing.  No flow limiting stenosis noted.    Left :    Left coronary artery originates from the left coronary artery cusp    Left main coronary artery is patent.    Scattered calcified and noncalcified plaque is seen through the circumflex  coronary artery.  Circumflex coronary artery gives rise to patent obtuse  marginal branches.  No flow limiting  stenosis noted with the reservation that  phase misregistration artifact mildly degrades images of the circumflex    Scattered calcified and noncalcified plaque is seen throughout the left  anterior descending coronary artery with scattered areas of mild luminal  narrowing.  Left anterior descending coronary artery gives rise to patent  diagonal branches.  No limiting stenosis    Mediastinum: Mild atherosclerotic changes seen in the ascending and  descending thoracic aorta...  Ascending aorta measures 2.8 cm.  No intimal  flap. Trace aortic annulus calcification is seen.  No mitral valve  calcification is seen.  No pericardial effusion.  No pericardial  calcification noted.  No significant mediastinal or hilar adenopathy.    Lungs/Pleura: No focal consolidation is seen on limited images of the lungs    Punctate noncalcified pulmonary nodule seen in the right middle lobe, near  the minor fissure measuring 3 mm.    Upper Abdomen: Limited images of the upper abdomen are unremarkable.    Soft Tissues/Bones: Spurring is seen spine    Impression  Scattered calcified and noncalcified plaque is seen throughout the coronary  arteries with scattered areas of mild luminal narrowing.  No flow limiting  stenosis noted with the reservation that the study is mildly limited by phase  misregistration artifact.    Punctate pulmonary nodule right middle lobe.  Please see follow-up  recommendations below    Calcium score 332.  This is in the 90th percentile for patient age  CARDIAC RHYTHM ASSESSMENT:    Holter/Event monitor  No results found for this or any previous visit.    EP studies / cardioversions   No results found for this or any previous visit.    ModerateRisk  ModerateComplexity/Medical Decision Making    Outside/Care everywhere records Reviewed  Labs Reviewed  Prior Imaging, ekg, cath, echo reviewed when available  Medications reviewed  Old Notes reviewed  ASSESSMENT AND PLAN     Encounter Diagnoses   Name Primary?     Coronary artery disease involving native coronary artery of native heart without angina pectoris Yes    Mixed hyperlipidemia     Primary hypertension     Angina pectoris (HCC)        Overall she is doing well, with good relief of her vasospastic angina with amlodipine and Ranexa.  Her dizziness sounds orthostatic; I advised her to increase salt and water intake, and to consider reducing amlodipine to 2.5 mg if still getting dizzy, given a lowish blood pressure in office today.        All new cardiac testing and lab results personally reviewed by me during this office visit and discussed with patient.    Patient counseled on lifestyle modification, diet, and exercise.  Medication changes today - ***   Continue risk factor modifications   Call for any new or worsening symptoms.   Follow Up:     Samule Dry, RN  Cardiologist [facility]    Scribe's attestation: This note was scribed in the presence of Dr. Joyce Gross,  MD by Marland Kitchen , RN

## 2022-08-06 MED ORDER — RANOLAZINE ER 500 MG PO TB12
500 MG | ORAL_TABLET | Freq: Two times a day (BID) | ORAL | 3 refills | Status: DC
Start: 2022-08-06 — End: 2023-08-05

## 2022-08-06 NOTE — Telephone Encounter (Signed)
Last OV: 07/25/22  Last labs/ EKG: 07/25/22 ekg  Appt scheduled : n/a

## 2023-01-15 IMAGING — CT CT CERVICAL SPINE W/O CM
3 of 4 series · 13 of 33 positions shown, 16 images · non-contrast
Comparison: None.

CLINICAL DATA: Cervical radiculopathy, no red flags, motor neuron
disease, history of prior falls

EXAM:
CT HEAD WITHOUT CONTRAST
CT CERVICAL SPINE WITHOUT CONTRAST
TECHNIQUE: Multidetector CT imaging of the head and cervical spine was
performed following the standard protocol without intravenous
contrast. Multiplanar CT image reconstructions of the cervical spine
were also generated.

[Series 4: c_spine 2.0 st · axial · 0.35mm/px · z∈[-242,-122]mm · 5 of 90 slices shown, 7 images]
[im 15/90  soft-tissue]
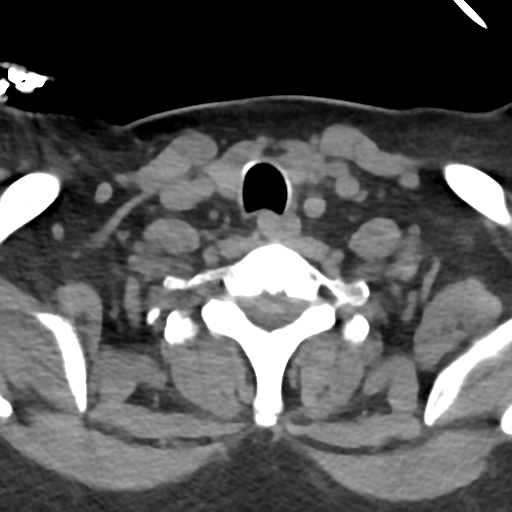
[im 15/90  bone]
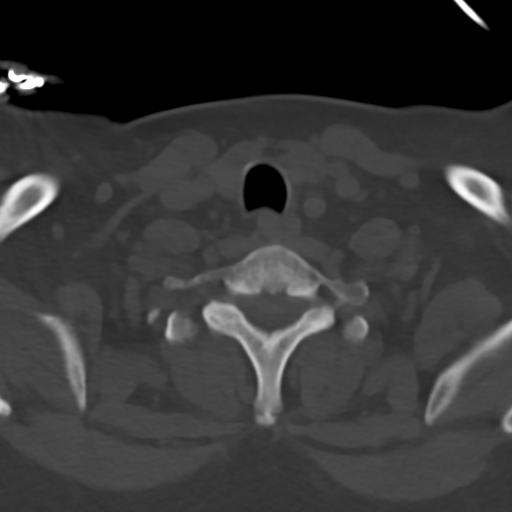
[im 30/90  bone]
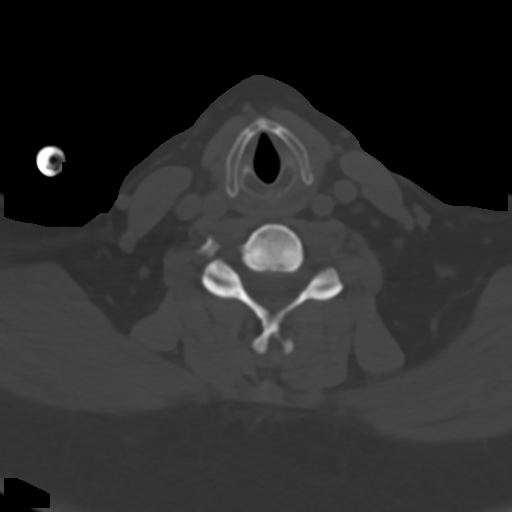
[im 45/90  bone]
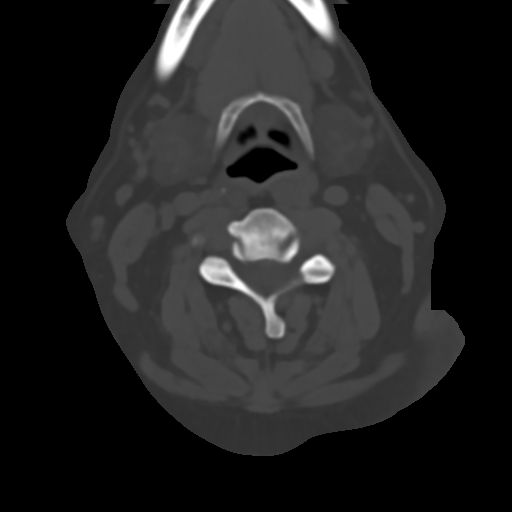
[im 60/90  bone]
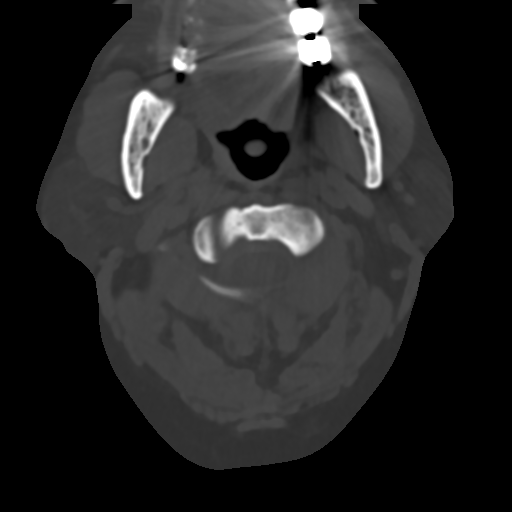
[im 75/90  soft-tissue]
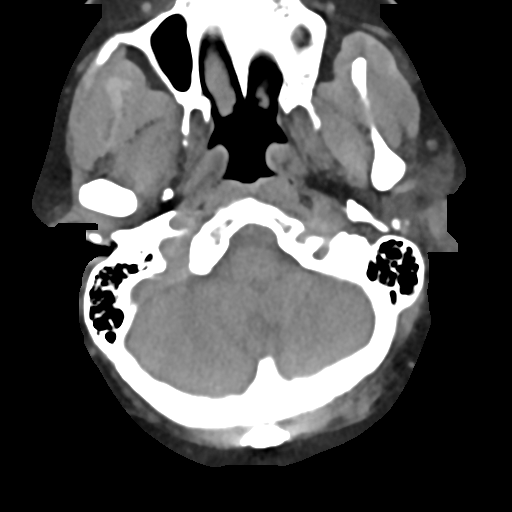
[im 75/90  bone]
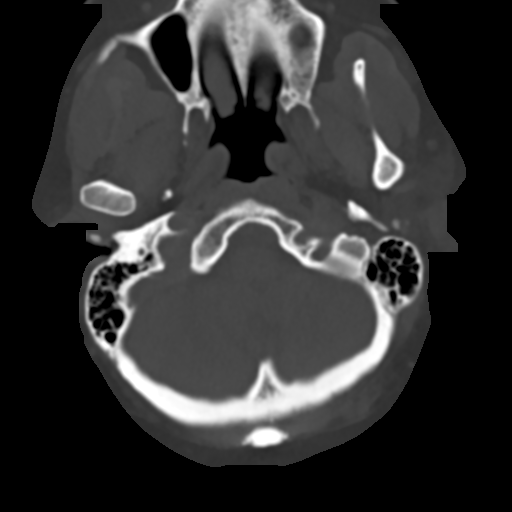

[Series 6: c_spine 2.0 sag bone · sagittal · 0.26mm/px · 5 of 61 slices shown, 6 images]
[im 21/61  bone]
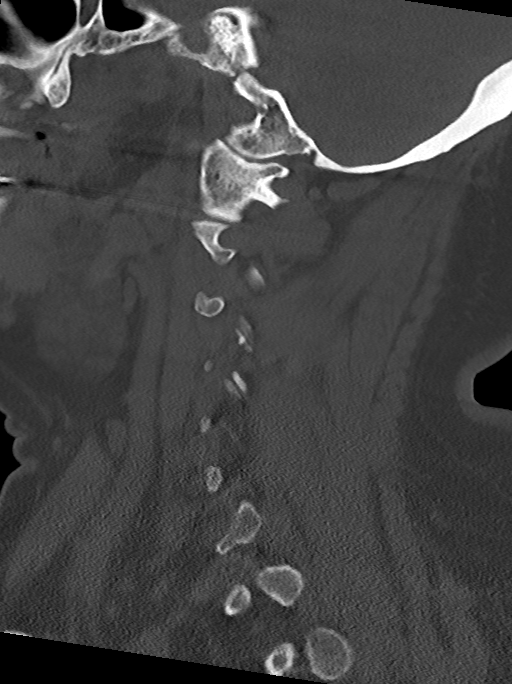
[im 26/61  bone]
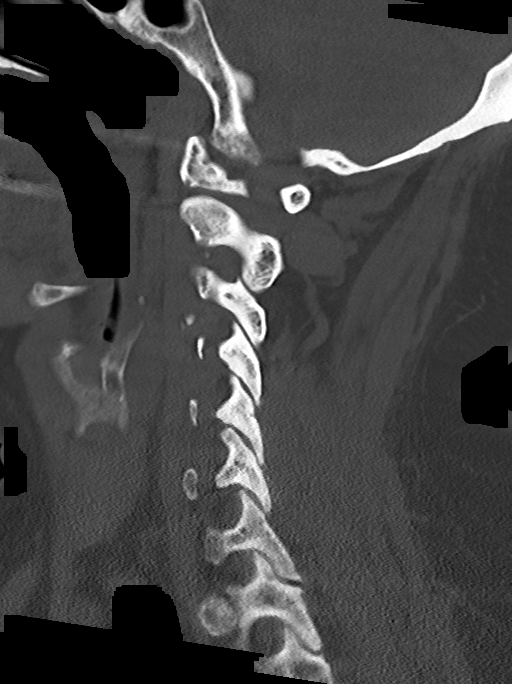
[im 31/61  soft-tissue]
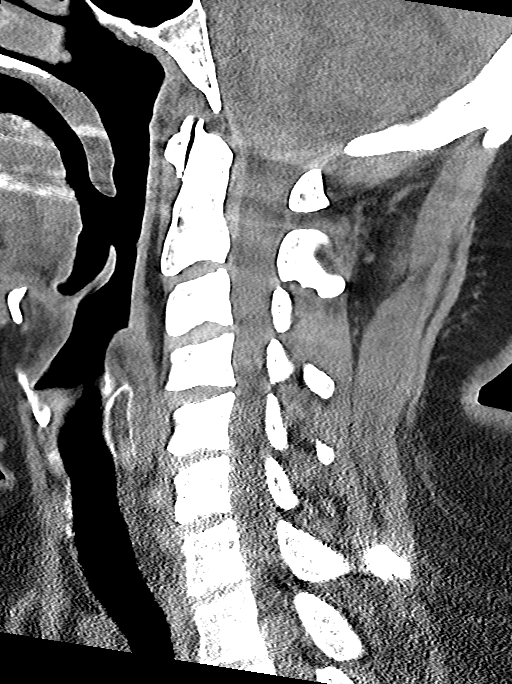
[im 31/61  bone]
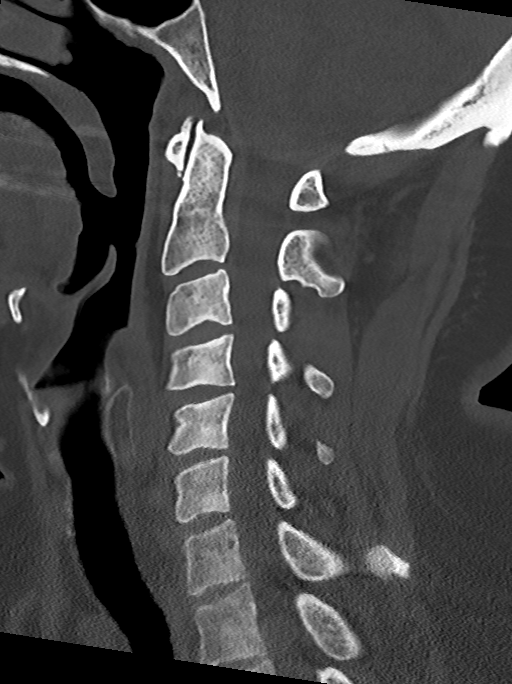
[im 36/61  bone]
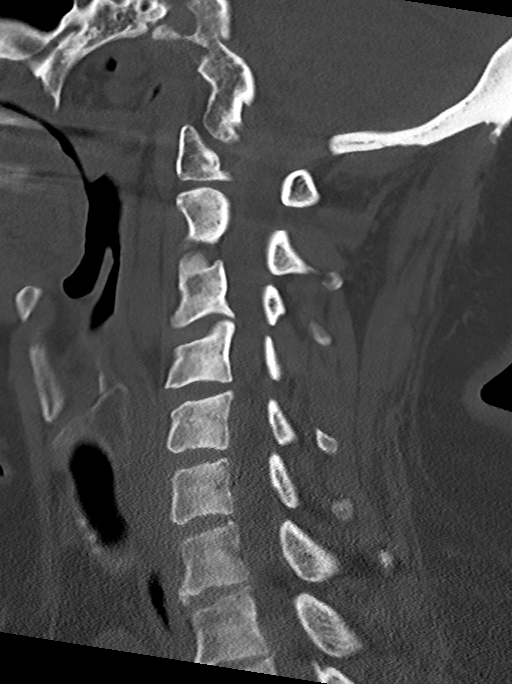
[im 41/61  bone]
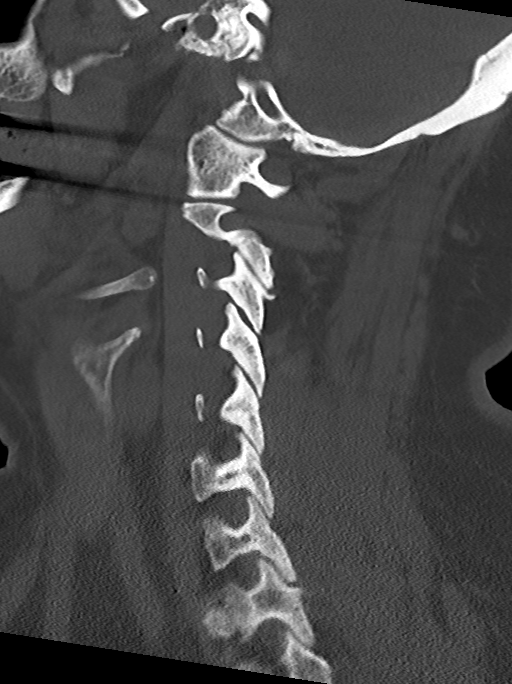

[Series 7: c_spine 2.0 cor bone · coronal · 0.26mm/px · 3 of 61 slices shown]
[im 13/61  bone]
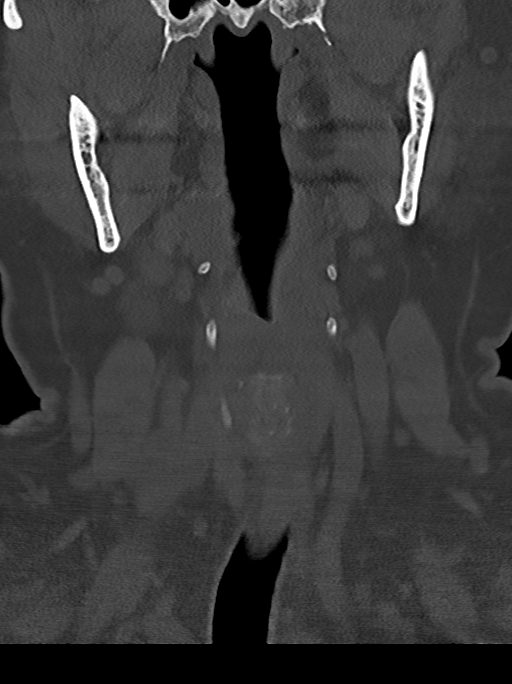
[im 25/61  bone]
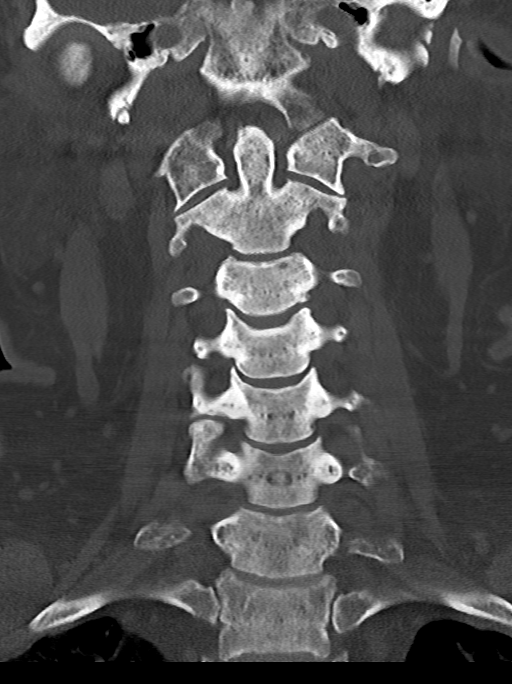
[im 37/61  bone]
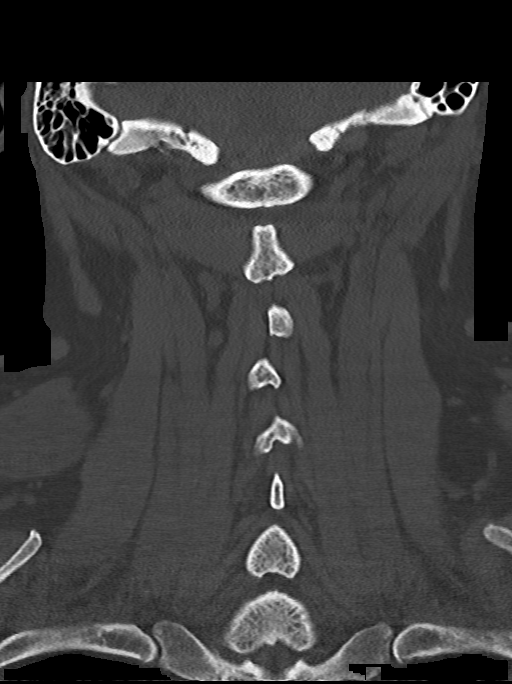

[13 of 33 positions shown; findings below may reference images not displayed]

FINDINGS: CT HEAD FINDINGS

Brain: No evidence of acute infarction, hemorrhage, hydrocephalus,
extra-axial collection or mass lesion/mass effect.

Vascular: No hyperdense vessel or unexpected calcification.

Skull: Normal. Negative for fracture or focal lesion.

Sinuses/Orbits: No acute finding.

Other: None.

CT CERVICAL SPINE FINDINGS

Alignment: Normal.

Skull base and vertebrae: No acute fracture. No primary bone lesion
or focal pathologic process.

Soft tissues and spinal canal: No prevertebral fluid or swelling. No
visible canal hematoma.

Disc levels:  Intact.

Upper chest: Negative.

Other: None.
IMPRESSION: 1. No acute intracranial pathology.
2. No fracture or static subluxation of the cervical spine.
3. Disc spaces are generally intact without CT etiology for cervical
radiculopathy. MRI may be used to better evaluate cervical disc and
neural foraminal pathology if indicated by neurologically localizing
signs and symptoms.

## 2023-01-22 IMAGING — DX DG CHEST 2V
2 series · 2 of 2 positions shown · non-contrast
Comparison: Chest x-ray 06/18/20

CLINICAL DATA: Chest pain.

EXAM:
CHEST - 2 VIEW

[chest pa]
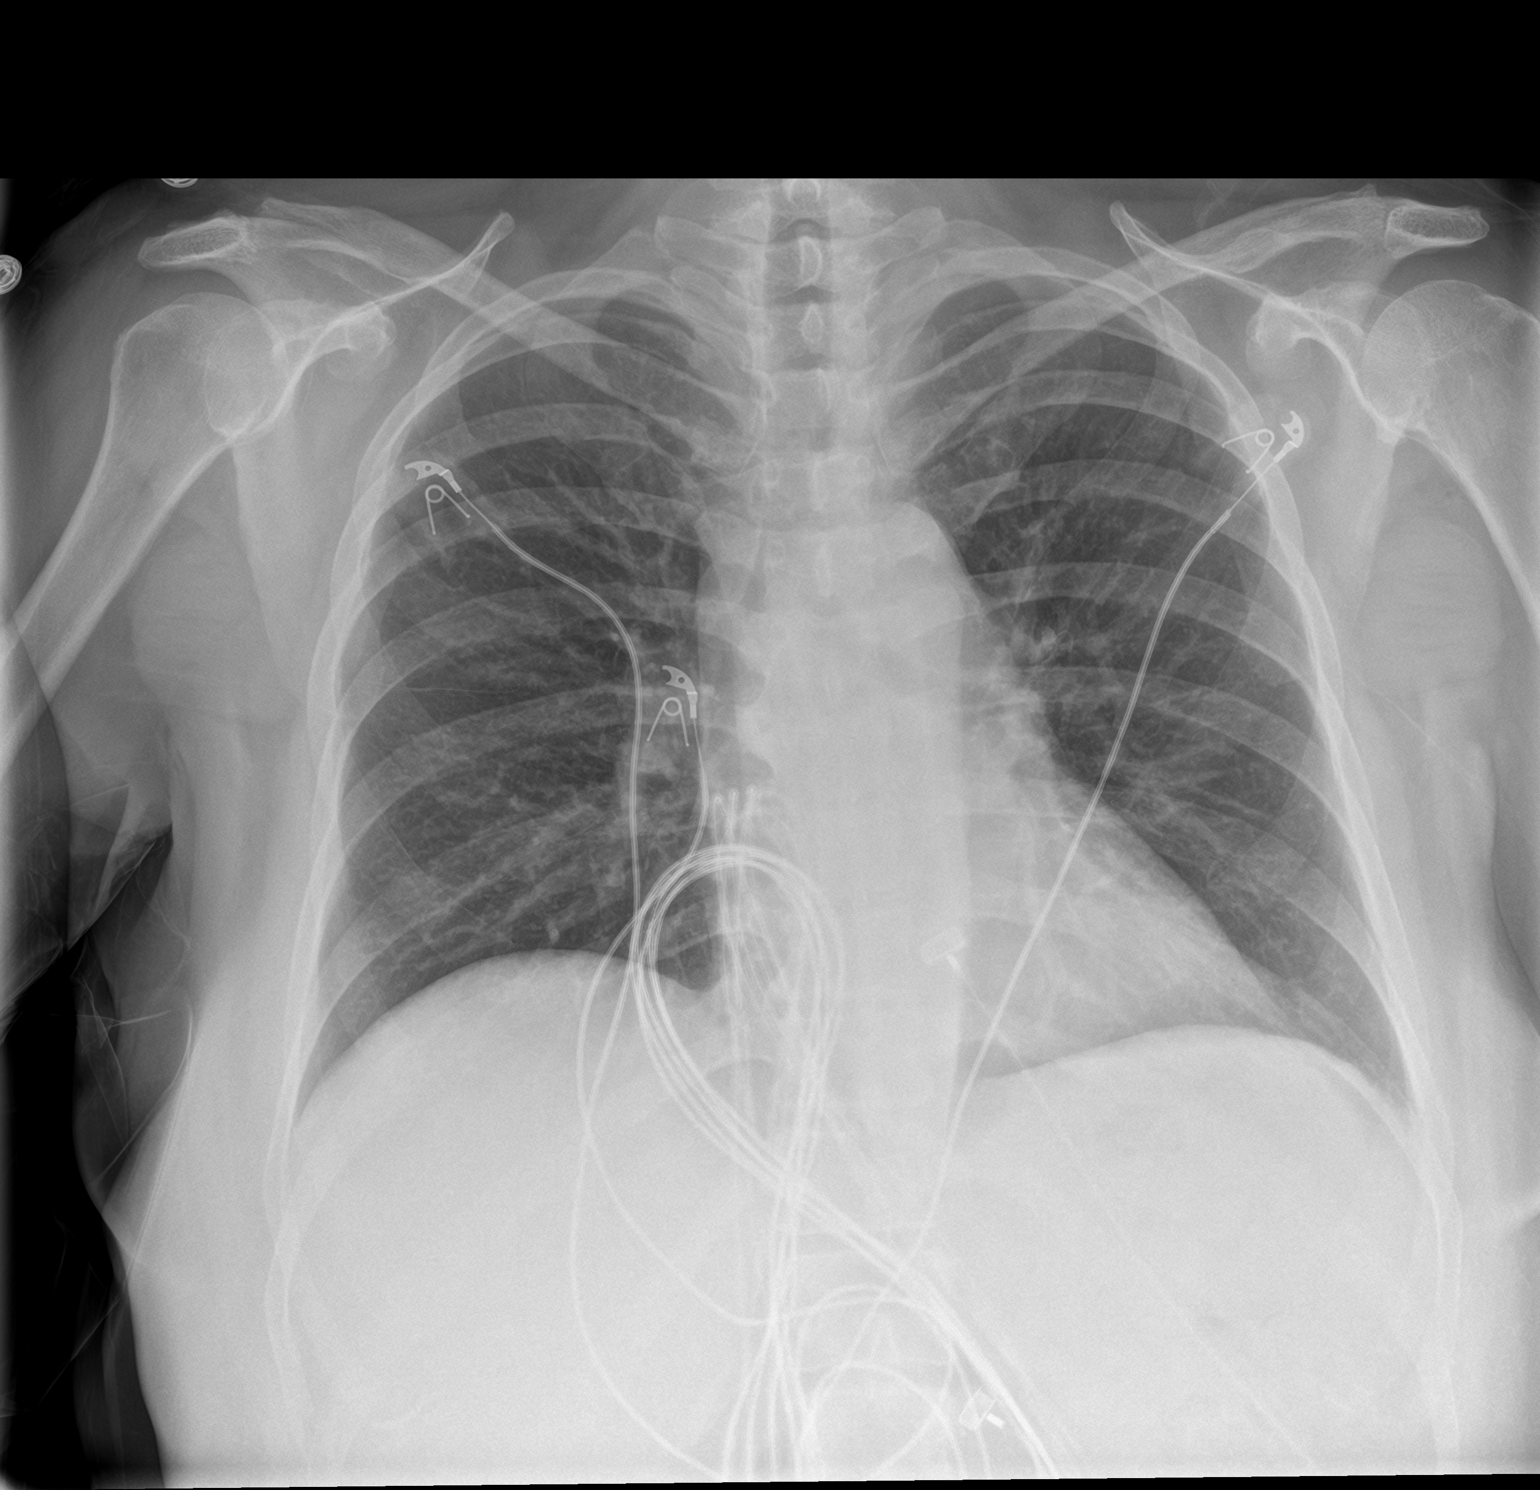

[chest lat]
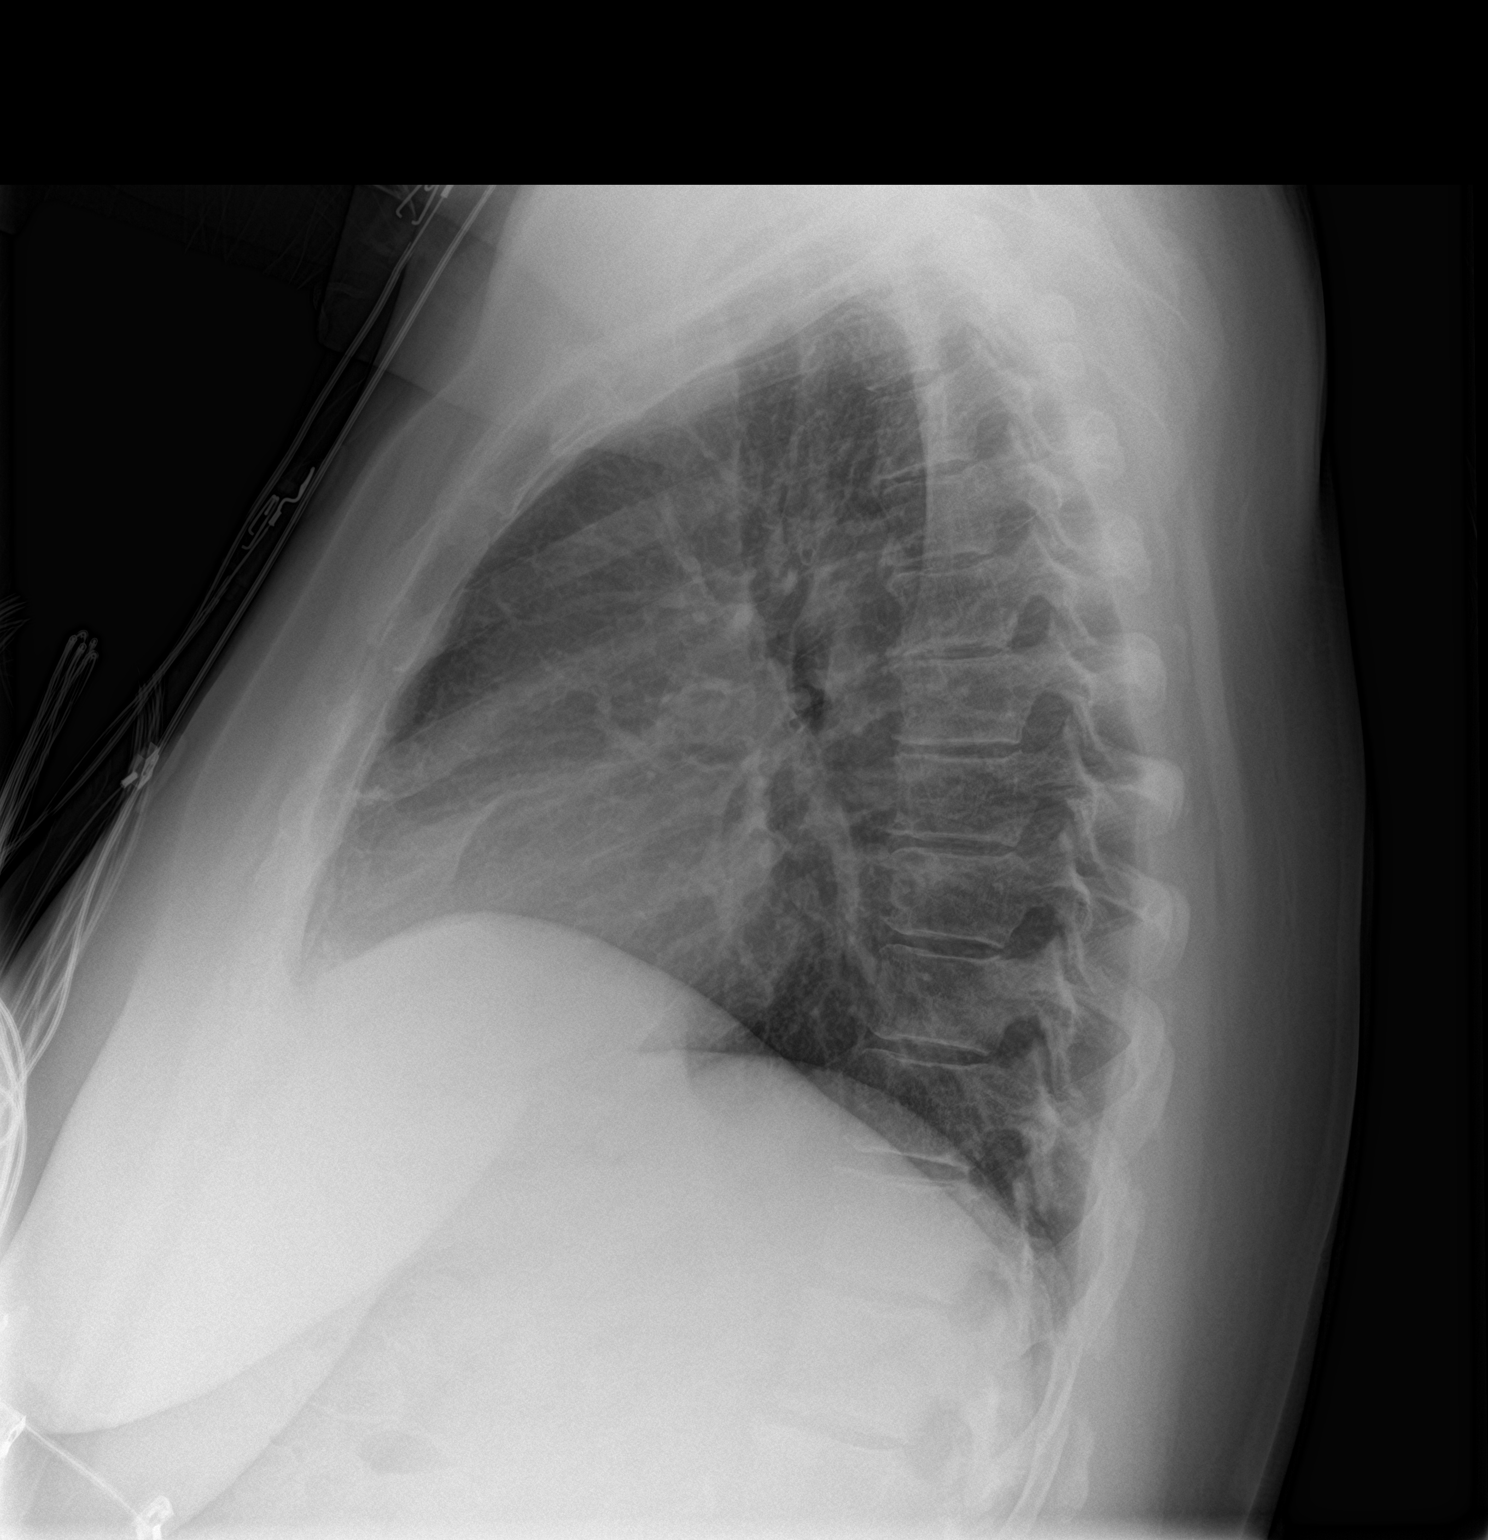

[2 of 2 positions shown; findings below may reference images not displayed]

FINDINGS: The heart size and mediastinal contours are unchanged.

No focal consolidation. No pulmonary edema. No pleural effusion. No
pneumothorax.

No acute osseous abnormality.
IMPRESSION: No active cardiopulmonary disease.

## 2023-02-17 ENCOUNTER — Other Ambulatory Visit (HOSPITAL_COMMUNITY): Payer: Self-pay | Admitting: Nurse Practitioner

## 2023-02-17 DIAGNOSIS — Z1231 Encounter for screening mammogram for malignant neoplasm of breast: Secondary | ICD-10-CM

## 2023-03-06 ENCOUNTER — Ambulatory Visit (HOSPITAL_COMMUNITY)
Admission: RE | Admit: 2023-03-06 | Discharge: 2023-03-06 | Disposition: A | Discharge status: Home / Self Care | Payer: No Typology Code available for payment source | Source: Ambulatory Visit | Attending: Nurse Practitioner | Admitting: Nurse Practitioner

## 2023-03-06 ENCOUNTER — Encounter (HOSPITAL_COMMUNITY): Payer: Self-pay

## 2023-03-06 DIAGNOSIS — Z1231 Encounter for screening mammogram for malignant neoplasm of breast: Secondary | ICD-10-CM | POA: Insufficient documentation

## 2023-08-05 MED ORDER — RANOLAZINE ER 500 MG PO TB12
500 | ORAL_TABLET | Freq: Two times a day (BID) | ORAL | 3 refills | 30.00000 days | Status: AC
Start: 2023-08-05 — End: ?

## 2023-08-05 NOTE — Telephone Encounter (Signed)
 Requested Prescriptions     Pending Prescriptions Disp Refills    ranolazine  (RANEXA ) 500 MG extended release tablet 180 tablet 3     Sig: Take 1 tablet by mouth 2 times daily      Last OV:  07/25/2022 [facility]    Next OV: 10/05/2023 [facility]    Last EKG: 07/25/2022      Last Filled: 08/06/2022 [facility]

## 2023-10-05 ENCOUNTER — Ambulatory Visit
Admit: 2023-10-05 | Discharge: 2023-10-05 | Payer: BLUE CROSS/BLUE SHIELD | Attending: Cardiovascular Disease | Primary: Family

## 2023-10-05 DIAGNOSIS — I2510 Atherosclerotic heart disease of native coronary artery without angina pectoris: Principal | ICD-10-CM

## 2023-10-05 MED ORDER — NICOTINE 14 MG/24HR TD PT24
14 | MEDICATED_PATCH | Freq: Every day | TRANSDERMAL | 0 refills | 28.00000 days | Status: DC
Start: 2023-10-05 — End: 2023-10-13

## 2023-10-05 MED ORDER — EVOLOCUMAB 140 MG/ML SC SOSY
140 | SUBCUTANEOUS | 3 refills | 84.00000 days | Status: AC
Start: 2023-10-05 — End: ?

## 2023-10-05 NOTE — Progress Notes (Signed)
 [facility]      Cardiology Follow Up   [phone]  10/05/23  Referring: Dr.  Anderson Norris, APRN - CNP (PCP)    REASON FOR CONSULT/CHIEF COMPLAINT/HPI     Reason for visit/ Chief complaint  Chest pain / vasospastic angina  Chief Complaint   Patient presents with    1 Year Follow Up    Coronary Artery Disease    Hyperlipidemia    Hypertension      HPI Yvonne King is a 58 y.o. woman with nonobstructive CAD and coronary vasospasm.  I initially met her in-hospital in 04/2021 at which time she had a cath demonstrating 3-vessel plaque including 50% stenosis in her RCA.  No intervention was needed.    I started her on Imdur , which caused severe headaches, so she was instead changed to amlodipine  and Ranexa .  She has had complete relief of symptoms, although she has occasional twinges of chest pain if she forgets to take her Ranexa  for more than a day or two.    She has HLP and premature FH of MI in her mother, who had an MI in her early 63s.      Occ low blood pressures, not new for her.     She works for a [employer].    Today, she reports some brief twinges of pain under her left breast. She hasn't had them in a few months but had them consistently for a while.  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 Her BP has been controlled. She is smoking at least a ppd of cigarettes and would like to try a nicotine  patch. She has been doing Pilates regularly.    Patient is adherent with medications and is tolerating them well without side effects     HISTORY/ALLERGIES/ROS     MedHx:  has a past medical history of Endometriosis, GERD (gastroesophageal reflux disease), HTN (hypertension), and Migraine.  SurgHx:  has a past surgical history that includes Hysterectomy; Cesarean section; Appendectomy; pelvic laparoscopy; cyst removal (Right, 07/29/12); and Wrist ganglion excision (07/29/12).   SocHx:  reports that she has been smoking cigarettes. She has been exposed to tobacco smoke. She  does not have any smokeless tobacco history on file. She reports current alcohol use.   FamHx:   Family History   Problem Relation Age of Onset    Cancer Sister         breast    Heart Failure Mother         mi     Hypertension Father      Allergies: Imitrex [sumatriptan], Imdur  [isosorbide  nitrate], and Sulfa antibiotics     MEDICATIONS      Prior to Admission medications   Medication Sig Start Date End Date Taking? Authorizing Provider   ranolazine  (RANEXA ) 500 MG extended release tablet Take 1 tablet by mouth 2 times daily 08/05/23  Yes Shawnee Elsie Righter, MD   atorvastatin  (LIPITOR ) 80 MG tablet Take 1 tablet by mouth at bedtime 07/25/22  Yes Shawnee Elsie Righter, MD   lisinopril  (PRINIVIL ;ZESTRIL ) 40 MG tablet Take 1 tablet by mouth daily 10/22/21  Yes Shawnee Elsie Righter, MD   pantoprazole  (PROTONIX ) 40 MG tablet Take 1 tablet by mouth 2 times daily 10/22/21  Yes Shawnee Elsie Righter, MD   amLODIPine  (NORVASC ) 5 MG tablet Take 1 tablet by mouth daily 10/22/21  Yes Shawnee Elsie Righter, MD   aspirin  81 MG tablet Take 1 tablet by  mouth daily   Yes Dann Planas L, DO     PHYSICAL EXAM        Vitals:    10/05/23 1500   BP: 124/72   Pulse: 83   SpO2: 98%        Weight - Scale: 71.2 kg (157 lb)     Gen Alert, cooperative, no distress Heart  Regular rate and rhythm, no murmur.  Normal S1 and S2.  Warm/well perfused.  No JVD.     HEENT Normocephalic, atraumatic.  Conj/corn clear  Lips, mucosa, tongue normal Abd  Soft, nontender, nondistended, no organomegaly   Neck Supple, trachea midline Ext  Ext nl, AT, no C/C, no edema   Lungs Lungs clear to auscultation bilaterally, unlabored breathing Pulse 2+ and symmetric   Chest wall No deformity Skin Color/text/turg nl, no rash/lesions   Neuro No gross deficits Psych Nl mood and affect     LABS and Imaging     Relevant and available CV data reviewed    EKG personally interpreted 10/05/23 - NSR nml ECG    Results for orders placed or performed during the hospital encounter of 04/30/21   EKG 12  Lead    Collection Time: 04/30/21  3:05 PM   Result Value Ref Range    Ventricular Rate 77 BPM    Atrial Rate 77 BPM    P-R Interval 146 ms    QRS Duration 90 ms    Q-T Interval 414 ms    QTc Calculation (Bazett) 468 ms    P Axis 30 degrees    R Axis 50 degrees    T Axis 33 degrees    Diagnosis       Normal sinus rhythmNormal ECGConfirmed by SHAWNEE FALLOW [phone]) on 05/01/2021 11:13:04 AM     Lab Results   Component Value Date    CHOL 127 05/01/2021    TRIG 62 05/01/2021    HDL 44 05/01/2021     Hemoglobin A1C   Date Value Ref Range Status   05/01/2021 5.7 See comment % Final     Comment:     Comment:  Diagnosis of Diabetes: > or = 6.5%  Increased risk of diabetes (Prediabetes): 5.7-6.4%  Glycemic Control: Nonpregnant Adults: <7.0%                    Pregnant: <6.0%          No results found for: TSHFT4, TSH, TSH3GEN  05/13/22  Component  Ref Range & Units 2 mo ago   TSH  0.450 - 4.500 uIU/mL 1.420     Component  Ref Range & Units 05/13/22   Glucose  70 - 99 mg/dL 90   Uric Acid  3.0 - 7.2 mg/dL 3.6   BUN  6 - 24 mg/dL 12   Creatinine  9.42 - 1.00 mg/dL 9.03   eGFR  >40 fO/fpw/8.26 69   BUN/Creatinine Ratio  9 - 23 13   Sodium  134 - 144 mmol/L 140   Potassium  3.5 - 5.2 mmol/L 4.4   Chloride  96 - 106 mmol/L 102   Calcium   8.7 - 10.2 mg/dL 9.5   Phosphorus  3.0 - 4.3 mg/dL 3.5   Total Protein  6.0 - 8.5 g/dL 6.7   Albumin  3.8 - 4.9 g/dL 4.5   Globulin, Total  1.5 - 4.5 g/dL 2.2   A/G Ratio  1.2 - 2.2 2.0   Total Bilirubin  0.0 - 1.2 mg/dL  0.5   Alkaline Phosphatase  44 - 121 IU/L 78   LD  119 - 226 IU/L 172   AST  0 - 40 IU/L 13   ALT (SGPT)  0 - 32 IU/L 20   GGT  0 - 60 IU/L 9   Iron  27 - 159 ug/dL 897   Cholesterol  899 - 199 mg/dL 827   Triglycerides  0 - 149 mg/dL 853   HDL  >60 mg/dL 58   VLDL Cholesterol Cal  5 - 40 mg/dL 25   LDL Calculated  0 - 99 mg/dL 89   Chol/HDL Ratio  0.0 - 4.4 ratio 3.0     CARDIAC IMAGING/TESTING:    Echo:   No results found for this or any previous visit.    Stress:  NM  MYOCARDIAL SPECT REST EXERCISE OR RX 05/01/2021 10:47 AM, 05/01/2021 12:00 AM (Final)  **Myocardial perfusion is abnormal. Mixed ischemia and scar. Low-moderate  risk scan. Hypertensive response to exercise**     ISCHEMIC EVALUATION / CATH RESULTS  Cath:   [facility] 05/02/21   Left Heart Cath  Dominance: Right      LM: normal   LAD: 40% ostial, 30% mid stenoses  LCx: 20% mid lesion   RCA: 50% mid stenosis      LVEDP: 14 mmHg  LVEF: 60-65%     Impression/Recommendations:  Mild to moderate nonobstructive CAD, proximal LAD and mid RCA stenoses deferred by FFR evaluations.  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  ASA 81 mg, increased dose Atorvastatin , home Amlodipine .   Add Imdur .  OK to DC home today with close outpatient follow up.      Coronary CTA   CTA CARDIAC W C STRC MORP W CONTRAST 05/01/2021  7:11 PM (Final)    Calcium  score is 332.  The score of the LAD is 80.  The score of the  circumflex is 65.  The score of the right coronary artery is 187    Impression  Scattered calcified and noncalcified plaque is seen throughout the coronary  arteries with scattered areas of mild luminal narrowing.  No flow limiting  stenosis noted with the reservation that the study is mildly limited by phase  misregistration artifact.    Calcium  score 332.  This is in the 90th percentile for patient age    CARDIAC RHYTHM ASSESSMENT:    Holter/Event monitor  No results found for this or any previous visit.    EP studies / cardioversions   No results found for this or any previous visit.    Outside/Care everywhere records Reviewed  Labs Reviewed  Prior Imaging, ekg, cath, echo reviewed when available  Medications reviewed  Old Notes reviewed  ASSESSMENT AND PLAN     Nonobstructive CAD - LDL still > 70 despite max dose statin.  Continue statin.  Add Repatha .  Coronary vasospasm - continue current regimen of amlodipine  and Ranexa   HTN - well-controlled on current regimen.  Continue current medications.  Tobacco abuse - I provided 4  minutes of face-to-face counseling about quitting smoking, including discussion of pharmacologic aids to assist with this.  Will start nicotine  patches today.     10/05/23    Patient will be started on new medications:   1.nicotine  patch.  2. Repatha       Patient verbalizes understanding of the need for treatment and education provided at today's visit. Additional education materials will be provided in the AVS.     Continue risk factor modifications  Call for any new or worsening symptoms.     All new cardiac testing and lab results personally reviewed by me during this office visit and discussed with patient.    Patient counseled on lifestyle modification, diet, and exercise.    Follow up: 1 year     Testing: none     W. Beverley Murray, MD  [facility]  Noninvasive and Adult Congenital Cardiologist  [phone]  [email or web address]    Scribe Attestation: This note was scribed in the presence of Dr. Murray by Edsel Broker, RN.    Physician Attestation  The scribe wrote this note in the presence of me Thermon Beverley Murray, MD).  The scribe may have prepopulated components of this note with my historical  intellectual property under my direct supervision.  Any additions to this intellectual property were performed in my presence and at my direction.  Furthermore, the content and accuracy of this note have been reviewed by me with edits by me as needed.  Elsie Beverley Murray, MD 10/05/2023 4:16 PM

## 2023-10-05 NOTE — Patient Instructions (Addendum)
 Nicotine  patch 14 mg patch daily  Start Repatha  one injection every 14 days  Repeat lipid panel in January - ask lab to send a copy of results to Dr Shawnee  Follow up in 1 year    If you develop more symptoms or exertional symptoms

## 2023-10-05 NOTE — Progress Notes (Deleted)
 Duplicate please disregard

## 2023-10-13 MED ORDER — NICOTINE 21 MG/24HR TD PT24
21 | MEDICATED_PATCH | Freq: Every day | TRANSDERMAL | 0 refills | Status: AC
Start: 2023-10-13 — End: 2023-11-24

## 2023-10-13 NOTE — Telephone Encounter (Signed)
 Nicoderm 21mg  daily patch order for 30 day supply. Pt to have PCP follow for refills going forward per [facility].

## 2023-10-20 MED ORDER — NITROGLYCERIN 0.4 MG SL SUBL
0.4 | ORAL_TABLET | SUBLINGUAL | 3 refills | Status: AC | PRN
Start: 2023-10-20 — End: ?

## 2023-10-20 NOTE — Telephone Encounter (Signed)
 Patient stopped into [place] office    Pain in her back between shoulder blades, Lump in throat, Dizziness and nausea.     She feels like she is drunk.     The dizziness was not severe, she was able to drive.     She feels like there is something in her throat. Off [place].     Denied SOB, chest pains or blurred vision.

## 2023-10-20 NOTE — Telephone Encounter (Signed)
 Spoke to patient and she is still having the back pain. It is easing up some. Advised per [facility] message and script sent to verified pharmacy and reviewed instructions with patient. Pt v/u and was agreeable to plan.

## 2023-10-20 NOTE — Telephone Encounter (Signed)
 Spoke to Pt, pain in back between shoulder blades pain on right side started this morning after she got to work, she did state it comes/goes with walking, does not have any changes in pain with moving side-side or back movements, deep breathing has no effect on the pain, she was unsure if muscular or not. She stated she is dizzy similar to a drunk feeling, and nauseous after lunch. She stated she just got over bronchitis(self diagnosis), and was around someone who had COVID recently, she was around her grandson over the weekend who was sick and she does have some congestion.
